# Patient Record
Sex: Female | Born: 1980 | Hispanic: Yes | Marital: Married | Smoking: Never smoker
Health system: Southern US, Community
[De-identification: ages and names within clinical notes are randomized; demographics above are authoritative.]

## PROBLEM LIST (undated history)

## (undated) DIAGNOSIS — I10 Essential (primary) hypertension: Secondary | ICD-10-CM

## (undated) DIAGNOSIS — O24419 Gestational diabetes mellitus in pregnancy, unspecified control: Secondary | ICD-10-CM

## (undated) HISTORY — PX: APPENDECTOMY: SHX54

## (undated) HISTORY — PX: CARPAL TUNNEL RELEASE: SHX101

---

## 2020-06-18 ENCOUNTER — Emergency Department: Payer: No Typology Code available for payment source

## 2020-06-18 ENCOUNTER — Other Ambulatory Visit: Payer: Self-pay

## 2020-06-18 ENCOUNTER — Encounter: Payer: Self-pay | Admitting: Emergency Medicine

## 2020-06-18 DIAGNOSIS — S169XXA Unspecified injury of muscle, fascia and tendon at neck level, initial encounter: Secondary | ICD-10-CM | POA: Diagnosis present

## 2020-06-18 DIAGNOSIS — S20219A Contusion of unspecified front wall of thorax, initial encounter: Secondary | ICD-10-CM | POA: Diagnosis not present

## 2020-06-18 DIAGNOSIS — M79672 Pain in left foot: Secondary | ICD-10-CM | POA: Insufficient documentation

## 2020-06-18 DIAGNOSIS — R102 Pelvic and perineal pain: Secondary | ICD-10-CM | POA: Insufficient documentation

## 2020-06-18 DIAGNOSIS — S161XXA Strain of muscle, fascia and tendon at neck level, initial encounter: Secondary | ICD-10-CM | POA: Diagnosis not present

## 2020-06-18 LAB — URINALYSIS, COMPLETE (UACMP) WITH MICROSCOPIC
Bilirubin Urine: NEGATIVE
Glucose, UA: NEGATIVE mg/dL
Hgb urine dipstick: NEGATIVE
Ketones, ur: NEGATIVE mg/dL
Nitrite: NEGATIVE
Protein, ur: 100 mg/dL — AB
Specific Gravity, Urine: 1.01 (ref 1.005–1.030)
pH: 7 (ref 5.0–8.0)

## 2020-06-18 LAB — CBC WITH DIFFERENTIAL/PLATELET
Abs Immature Granulocytes: 0.07 10*3/uL (ref 0.00–0.07)
Basophils Absolute: 0.1 10*3/uL (ref 0.0–0.1)
Basophils Relative: 1 %
Eosinophils Absolute: 0.3 10*3/uL (ref 0.0–0.5)
Eosinophils Relative: 2 %
HCT: 36.3 % (ref 36.0–46.0)
Hemoglobin: 12.2 g/dL (ref 12.0–15.0)
Immature Granulocytes: 1 %
Lymphocytes Relative: 32 %
Lymphs Abs: 4.1 10*3/uL — ABNORMAL HIGH (ref 0.7–4.0)
MCH: 27.7 pg (ref 26.0–34.0)
MCHC: 33.6 g/dL (ref 30.0–36.0)
MCV: 82.5 fL (ref 80.0–100.0)
Monocytes Absolute: 0.7 10*3/uL (ref 0.1–1.0)
Monocytes Relative: 5 %
Neutro Abs: 7.6 10*3/uL (ref 1.7–7.7)
Neutrophils Relative %: 59 %
Platelets: 291 10*3/uL (ref 150–400)
RBC: 4.4 MIL/uL (ref 3.87–5.11)
RDW: 13.4 % (ref 11.5–15.5)
WBC: 12.7 10*3/uL — ABNORMAL HIGH (ref 4.0–10.5)
nRBC: 0 % (ref 0.0–0.2)

## 2020-06-18 LAB — BASIC METABOLIC PANEL
Anion gap: 11 (ref 5–15)
BUN: 13 mg/dL (ref 6–20)
CO2: 22 mmol/L (ref 22–32)
Calcium: 9.1 mg/dL (ref 8.9–10.3)
Chloride: 104 mmol/L (ref 98–111)
Creatinine, Ser: 0.72 mg/dL (ref 0.44–1.00)
GFR calc Af Amer: >60 mL/min (ref >60)
GFR calc non Af Amer: >60 mL/min (ref >60)
Glucose, Bld: 101 mg/dL — ABNORMAL HIGH (ref 70–99)
Potassium: 3.1 mmol/L — ABNORMAL LOW (ref 3.5–5.1)
Sodium: 137 mmol/L (ref 135–145)

## 2020-06-18 LAB — POCT PREGNANCY, URINE: Preg Test, Ur: NEGATIVE

## 2020-06-18 MED ORDER — FENTANYL CITRATE (PF) 100 MCG/2ML IJ SOLN
50.0000 ug | INTRAMUSCULAR | Status: DC | PRN
  Administered 2020-06-18: 50 ug via NASAL
  Filled 2020-06-18: qty 2
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
## 2020-06-19 ENCOUNTER — Emergency Department
Admission: EM | Admit: 2020-06-19 | Discharge: 2020-06-19 | Disposition: A | Discharge status: Home / Self Care | Payer: No Typology Code available for payment source | Attending: Emergency Medicine | Admitting: Emergency Medicine

## 2020-06-19 DIAGNOSIS — S161XXA Strain of muscle, fascia and tendon at neck level, initial encounter: Secondary | ICD-10-CM

## 2020-06-19 DIAGNOSIS — S20219A Contusion of unspecified front wall of thorax, initial encounter: Secondary | ICD-10-CM

## 2020-06-19 HISTORY — DX: Essential (primary) hypertension: I10

## 2020-06-19 MED ORDER — KETOROLAC TROMETHAMINE 30 MG/ML IJ SOLN
INTRAMUSCULAR | Status: AC
  Filled 2020-06-19: qty 1

## 2020-06-19 MED ORDER — KETOROLAC TROMETHAMINE 30 MG/ML IJ SOLN
30.0000 mg | Freq: Once | INTRAMUSCULAR | Status: AC
  Administered 2020-06-19: 30 mg via INTRAMUSCULAR

## 2020-06-19 MED ORDER — NAPROXEN 500 MG PO TABS: 500.0000 mg | ORAL_TABLET | Freq: Two times a day (BID) | ORAL | 2 refills | Status: DC
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
## 2020-09-29 ENCOUNTER — Emergency Department
Admission: EM | Admit: 2020-09-29 | Discharge: 2020-09-29 | Disposition: A | Discharge status: Home / Self Care | Payer: HRSA Program | Attending: Emergency Medicine | Admitting: Emergency Medicine

## 2020-09-29 ENCOUNTER — Other Ambulatory Visit: Payer: Self-pay

## 2020-09-29 DIAGNOSIS — I10 Essential (primary) hypertension: Secondary | ICD-10-CM | POA: Diagnosis not present

## 2020-09-29 DIAGNOSIS — R519 Headache, unspecified: Secondary | ICD-10-CM | POA: Diagnosis present

## 2020-09-29 DIAGNOSIS — Z20822 Contact with and (suspected) exposure to covid-19: Secondary | ICD-10-CM

## 2020-09-29 DIAGNOSIS — U071 COVID-19: Secondary | ICD-10-CM | POA: Insufficient documentation

## 2020-09-29 LAB — SARS CORONAVIRUS 2 (TAT 6-24 HRS): SARS Coronavirus 2: POSITIVE — AB

## 2020-09-29 NOTE — ED Notes (Signed)
Pt assessed by provider prior to d/c.  

## 2020-09-29 NOTE — ED Provider Notes (Signed)
[facility] Emergency Department Provider Note   ____________________________________________    I have reviewed the triage vital signs and the nursing notes.   HISTORY  Chief Complaint covid test     HPI Clariza Sickman Beverly Gust is a 40 y.o. female who presents with Covid symptoms and is requesting a Covid test.  She describes body aches, chills, headaches.  She reports close exposure to someone who tested positive for Covid  Past Medical History:  Diagnosis Date  . Hypertension     There are no problems to display for this patient.   Past Surgical History:  Procedure Laterality Date  . APPENDECTOMY    . CARPAL TUNNEL RELEASE      Prior to Admission medications   Medication Sig Start Date End Date Taking? Authorizing Provider  naproxen (NAPROSYN) 500 MG tablet Take 1 tablet (500 mg total) by mouth 2 (two) times daily with a meal. 06/19/20   Jene Every, MD     Allergies Patient has no known allergies.  History reviewed. No pertinent family history.  Social History Social History   Tobacco Use  . Smoking status: Never Smoker  . Smokeless tobacco: Never Used  Substance Use Topics  . Alcohol use: Not Currently  . Drug use: Never    Review of Systems  Constitutional: As above  ENT: Positive sore throat   Gastrointestinal: No abdominal pain.    Musculoskeletal: Body aches Skin: Negative for rash. Neurological: Headaches    ____________________________________________   PHYSICAL EXAM:  VITAL SIGNS: ED Triage Vitals  Enc Vitals Group     BP 09/29/20 1151 (!) 147/89     Pulse Rate 09/29/20 1151 84     Resp 09/29/20 1151 18     Temp 09/29/20 1151 98 F (36.7 C)     Temp Source 09/29/20 1151 Oral     SpO2 09/29/20 1151 100 %     Weight 09/29/20 1151 88.5 kg (195 lb)     Height 09/29/20 1151 1.651 m (5\' 5" )     Head Circumference --      Peak Flow --      Pain Score 09/29/20 1158 7     Pain Loc --      Pain  Edu? --      Excl. in GC? --     Constitutional: Alert and oriented. No acute distress.  Eyes: Conjunctivae are normal.  Head: Atraumatic. Nose: No congestion/rhinnorhea. Mouth/Throat: Mucous membranes are moist.   Cardiovascular: Normal rate, regular rhythm.  Respiratory: Normal respiratory effort.  No retractions.  Musculoskeletal: No lower extremity tenderness nor edema.   Neurologic:  Normal speech and language. No gross focal neurologic deficits are appreciated.   Skin:  Skin is warm, dry and intact. No rash noted.   ____________________________________________   LABS (all labs ordered are listed, but only abnormal results are displayed)  Labs Reviewed  SARS CORONAVIRUS 2 (TAT 6-24 HRS)   ____________________________________________  EKG   ____________________________________________  RADIOLOGY   ____________________________________________   PROCEDURES  Procedure(s) performed: No  Procedures   Critical Care performed: No ____________________________________________   INITIAL IMPRESSION / ASSESSMENT AND PLAN / ED COURSE  Pertinent labs & imaging results that were available during my care of the patient were reviewed by me and considered in my medical decision making (see chart for details).  Patient with suspected COVID-19 infection, PCR test sent   ____________________________________________   FINAL CLINICAL IMPRESSION(S) / ED DIAGNOSES  Final diagnoses:  Suspected COVID-19  virus infection      NEW MEDICATIONS STARTED DURING THIS VISIT:  Discharge Medication List as of 09/29/2020 12:13 PM       Note:  This document was prepared using Dragon voice recognition software and may include unintentional dictation errors.   Jene Every, MD 09/29/20 1259

## 2020-09-29 NOTE — ED Triage Notes (Signed)
Close contact with covid pos. C/o back pain.

## 2022-01-07 ENCOUNTER — Emergency Department: Payer: Self-pay

## 2022-01-07 ENCOUNTER — Other Ambulatory Visit: Payer: Self-pay

## 2022-01-07 ENCOUNTER — Emergency Department
Admission: EM | Admit: 2022-01-07 | Discharge: 2022-01-07 | Disposition: A | Discharge status: Home / Self Care | Payer: Self-pay | Attending: Emergency Medicine | Admitting: Emergency Medicine

## 2022-01-07 DIAGNOSIS — R791 Abnormal coagulation profile: Secondary | ICD-10-CM | POA: Diagnosis not present

## 2022-01-07 DIAGNOSIS — M549 Dorsalgia, unspecified: Secondary | ICD-10-CM | POA: Diagnosis not present

## 2022-01-07 DIAGNOSIS — R0789 Other chest pain: Secondary | ICD-10-CM | POA: Diagnosis present

## 2022-01-07 DIAGNOSIS — R000 Tachycardia, unspecified: Secondary | ICD-10-CM | POA: Diagnosis not present

## 2022-01-07 DIAGNOSIS — Z7982 Long term (current) use of aspirin: Secondary | ICD-10-CM | POA: Insufficient documentation

## 2022-01-07 DIAGNOSIS — I10 Essential (primary) hypertension: Secondary | ICD-10-CM | POA: Insufficient documentation

## 2022-01-07 HISTORY — DX: Gestational diabetes mellitus in pregnancy, unspecified control: O24.419

## 2022-01-07 LAB — CBC
HCT: 39.7 % (ref 36.0–46.0)
Hemoglobin: 13.6 g/dL (ref 12.0–15.0)
MCH: 29.2 pg (ref 26.0–34.0)
MCHC: 34.3 g/dL (ref 30.0–36.0)
MCV: 85.4 fL (ref 80.0–100.0)
Platelets: 208 10*3/uL (ref 150–400)
RBC: 4.65 MIL/uL (ref 3.87–5.11)
RDW: 13.7 % (ref 11.5–15.5)
WBC: 11.5 10*3/uL — ABNORMAL HIGH (ref 4.0–10.5)
nRBC: 0 % (ref 0.0–0.2)

## 2022-01-07 LAB — D-DIMER, QUANTITATIVE: D-Dimer, Quant: 0.6 ug/mL-FEU — ABNORMAL HIGH (ref 0.00–0.50)

## 2022-01-07 LAB — COMPREHENSIVE METABOLIC PANEL
ALT: 17 U/L (ref 0–44)
AST: 18 U/L (ref 15–41)
Albumin: 3.8 g/dL (ref 3.5–5.0)
Alkaline Phosphatase: 62 U/L (ref 38–126)
Anion gap: 11 (ref 5–15)
BUN: 7 mg/dL (ref 6–20)
CO2: 19 mmol/L — ABNORMAL LOW (ref 22–32)
Calcium: 9.5 mg/dL (ref 8.9–10.3)
Chloride: 104 mmol/L (ref 98–111)
Creatinine, Ser: 0.46 mg/dL (ref 0.44–1.00)
GFR, Estimated: >60 mL/min (ref >60)
Glucose, Bld: 80 mg/dL (ref 70–99)
Potassium: 3.7 mmol/L (ref 3.5–5.1)
Sodium: 134 mmol/L — ABNORMAL LOW (ref 135–145)
Total Bilirubin: 0.6 mg/dL (ref 0.3–1.2)
Total Protein: 7.5 g/dL (ref 6.5–8.1)

## 2022-01-07 LAB — TROPONIN I (HIGH SENSITIVITY)
Troponin I (High Sensitivity): 5 ng/L (ref <18)
Troponin I (High Sensitivity): 4 ng/L (ref <18)

## 2022-01-07 NOTE — ED Triage Notes (Signed)
Pt to ED via POV, complains of sharp pain in L chest through to L upper back, worse with walking and with standing. Better with lying. Pain started this morning at 1030. ? ?Pt has gestational diabetes and pregestational HTN. ? ?Pt states is pregnant, at least 19 weeks. Fetus is moving. ? ?EDP at bedside with interpreter. ?

## 2022-01-07 NOTE — ED Notes (Signed)
Pt given crackers and water by request  ?

## 2022-01-07 NOTE — ED Notes (Signed)
Patient resting on stretcher in room. RR even and unlabored. Patient is a little restless due to the pain in her left chest that radiates to her left shoulder. Patient is removed from monitoring equipment so that she can go to the bathroom. Patient verbalizes no other needs or complaints at this time. Family updated on plan of care.  ?

## 2022-01-07 NOTE — ED Provider Notes (Signed)
? ?[facility] ?Provider Note ? ? ? Event Date/Time  ? First MD Initiated Contact with Patient 01/07/22 1538   ?  (approximate) ? ? ?History  ? ?Chest Pain and Back Pain ? ?Spanish telemetry interpreter utilized throughout.  Examination of the chest and chest wall performed with nurse Barbee Cougheina at bedside ? ?HPI ? ?Rachael Alexander is a 41 y.o. female who [facility] from April 11 of this year 41 y.o. [id number] at [id number] with hypertension and gestational diabetes. ? ?At 10 AM today patient began experiencing a somewhat sharp pain in the middle of her left upper chest along the edge of the breastbone that radiates towards her left shoulder and upper back.  It is worsened with standing walking and moving.  Is relieved by sitting still ? ?She took Tylenol 2 hours ago.  She takes a baby aspirin daily as directed by her OB clinic and [facility]. ? ?No shortness of breath.  No history of blood clots.  Taking medication for high blood pressure and also baby aspirin ? ?No nausea or vomiting.  No fevers or chills.  Denies cough. ? ?No leg swelling.  No history of blood clots. ?  ? ? ?Physical Exam  ? ?Triage Vital Signs: ?ED Triage Vitals  ?Enc Vitals Group  ?   BP 01/07/22 1542 (!) 154/92  ?   Pulse Rate 01/07/22 1542 (!) 106  ?   Resp 01/07/22 1542 16  ?   Temp 01/07/22 1542 98.1 ?F (36.7 ?C)  ?   Temp Source 01/07/22 1542 Oral  ?   SpO2 01/07/22 1542 100 %  ?   Weight 01/07/22 1553 187 lb (84.8 kg)  ?   Height 01/07/22 1553 5\' 3"  (1.6 m)  ?   Head Circumference --   ?   Peak Flow --   ?   Pain Score 01/07/22 1551 7  ?   Pain Loc --   ?   Pain Edu? --   ?   Excl. in GC? --   ? ? ?Most recent vital signs: ?Vitals:  ? 01/07/22 1900 01/07/22 1930  ?BP: 104/64 110/82  ?Pulse: 89 91  ?Resp: 17 15  ?Temp:    ?SpO2: 97% 99%  ? ? ? ?General: Awake, no distress.  Very pleasant.  Family at bedside as well.  Communicates without difficulty via  Spanish interpreter. ?CV:  Good peripheral perfusion.  Normal heart tones.  Examination of chest wall is normal.  No reproducible pain or tenderness to palpation of the anterior chest wall, but she does report with movement and attempt to stand or walk which she gets worsening of the pain in her left upper chest.  Relatively sharp in nature. ?Resp:  Normal effort.  Clear lungs bilaterally.  Normal work of breathing. ?Abd:  No distention.  Soft nontender nondistended.  Gravid just below the level of the umbilicus ?Other:  No lower extremity swelling edema venous cords or congestion.  No calf tenderness. ? ?Fetal heart tones noted, heart rate 150-160 ? ?ED Results / Procedures / Treatments  ? ?Labs ?(all labs ordered are listed, but only abnormal results are displayed) ?Labs Reviewed  ?CBC - Abnormal; Notable for the following components:  ?    Result Value  ? WBC 11.5 (*)   ? All other components within normal limits  ?COMPREHENSIVE METABOLIC PANEL - Abnormal; Notable for the following components:  ?  Sodium 134 (*)   ? CO2 19 (*)   ? All other components within normal limits  ?D-DIMER, QUANTITATIVE - Abnormal; Notable for the following components:  ? D-Dimer, Quant 0.60 (*)   ? All other components within normal limits  ?TROPONIN I (HIGH SENSITIVITY)  ?TROPONIN I (HIGH SENSITIVITY)  ? ? ? ?EKG ? ?Interpreted by me at 1547 ?Heart rate 110 ?QRS 90 ?QTc 480 ?Sinus tachycardia, slight baseline artifact.  No evidence of acute ischemia denoted. ? ? ?RADIOLOGY ? ? ? ?Chest x-ray interpreted and reviewed by me negative for acute finding ? ? ?PROCEDURES: ? ?Critical Care performed: No ? ?Procedures ? ? ?MEDICATIONS ORDERED IN ED: ?Medications - No data to display ? ? ?IMPRESSION / MDM / ASSESSMENT AND PLAN / ED COURSE  ?I reviewed the triage vital signs and the nursing notes. ?             ?               ? ?Differential diagnosis includes, but is not limited to, possible musculoskeletal or chest wall pain, costochondritis,  muscular pectoralis strain, or referred pain from the lungs.  Pleurisy is also considered.  Will evaluate and work-up and exclude ACS though her symptomatology seems atypical.  She denies any associate abdominal pain.  No acute obstetrical symptoms.  Reports feeling her baby moving normally not having any abdominal pain or contractions.  No loss of fluid.  Does not appear to have evidence of an acute obstetrical etiology of her presentation or pain today.  The pain is focused primarily in the left mid chest with radiation towards left back and shoulder.  She is hypertensive, but given her age and risk factors dissection would seem quite unlikely.  Additionally, low risk for pulmonary embolism her only noted risk factor at this point would be pregnancy status.  We will send D-dimer. ? ?----------------------------------------- ?3:59 PM on 01/07/2022 ?----------------------------------------- ?Awaiting EKG at this time, nursing working to obtain. ? ? ?In consideration of the patient's pregnancy status, utilize years algorithm to evaluate for risk of PE.  The patient is felt to be low risk, she does not have any clinical signs of DVT, no hemoptysis, and I feel most likely musculoskeletal etiology.  Her D-dimer is less than 1000 (600 today).  Pulmonary embolism reasonably excluded based on this testing ? ? ?The patient is on the cardiac monitor to evaluate for evidence of arrhythmia and/or significant heart rate changes. ? ?Clinical Course as of 01/07/22 1938  ?Sat Jan 07, 2022  ?[id number] Discussed with patient via Spanish interpreter risks and benefits of imaging study including chest x-ray as well as CT scan if required to further evaluate her chest pain.  Discussed risks such as risk of radiation, congenital abnormalities, early childhood cancers, etc.  Patient understanding and agreeable to proceeding with imaging studies. [MQ]  ?1714 D-dimer is noted to be elevated slightly greater than threshold. [MQ]  ?1714 Patient  was able to give additional history, reported that she actually started having some pain after straining her arm [MQ]  ?1715  she believes starting a lawn more on Thursday or Friday.  This significantly reduces my concern for possible thromboembolic disease as well. [MQ]  ?  ?Clinical Course User Index ?[MQ] Sharyn Creamer, MD  ? ?----------------------------------------- ?7:37 PM on 01/07/2022 ?----------------------------------------- ?Second troponin normal.  No evidence to support acute emergent etiology of patient's pain at this time.  Suspect given the history of symptoms starting after attempting to pull  start a lawnmower I suspect this is likely musculoskeletal in nature.  Very reassuring examination of ED.  Discussed with the patient via Spanish interpreter, [place], return precautions and plan and recommendation to follow-up with [facility]. ? ?Return precautions and treatment recommendations and follow-up discussed with the patient who is agreeable with the plan. ? ? ?FINAL CLINICAL IMPRESSION(S) / ED DIAGNOSES  ? ?Final diagnoses:  ?Atypical chest pain  ? ? ? ?Rx / DC Orders  ? ?ED Discharge Orders   ? ? None  ? ?  ? ? ? ?Note:  This document was prepared using Dragon voice recognition software and may include unintentional dictation errors. ?  Sharyn Creamer, MD ?01/07/22 1938 ? ?

## 2022-01-07 NOTE — ED Notes (Signed)
XRAY called to get chest XRAY, pt states she is okay with this even thought she is pregnant, MD requested this RN call XRAY  ?

## 2022-01-07 NOTE — ED Notes (Signed)
Discharge instructions and follow-up provided to patient with assistance of interpreter. Patient verbalized understanding. Patient ambulated out to the waiting room with a steady gait ?

## 2022-01-07 NOTE — ED Notes (Signed)
Fetal Heart tones noted to be 150's. ?

## 2022-07-07 ENCOUNTER — Observation Stay: Payer: Self-pay | Admitting: Certified Registered"

## 2022-07-07 ENCOUNTER — Observation Stay
Admission: EM | Admit: 2022-07-07 | Discharge: 2022-07-08 | Disposition: A | Discharge status: Home / Self Care | Payer: Self-pay | Attending: Surgery | Admitting: Surgery

## 2022-07-07 ENCOUNTER — Other Ambulatory Visit: Payer: Self-pay

## 2022-07-07 ENCOUNTER — Emergency Department: Payer: Self-pay

## 2022-07-07 ENCOUNTER — Encounter
Admission: EM | Disposition: A | Discharge status: Home / Self Care | Payer: Self-pay | Source: Home / Self Care | Attending: Emergency Medicine

## 2022-07-07 DIAGNOSIS — K810 Acute cholecystitis: Secondary | ICD-10-CM | POA: Diagnosis present

## 2022-07-07 DIAGNOSIS — E119 Type 2 diabetes mellitus without complications: Secondary | ICD-10-CM | POA: Insufficient documentation

## 2022-07-07 DIAGNOSIS — K8042 Calculus of bile duct with acute cholecystitis without obstruction: Principal | ICD-10-CM | POA: Insufficient documentation

## 2022-07-07 DIAGNOSIS — I10 Essential (primary) hypertension: Secondary | ICD-10-CM | POA: Insufficient documentation

## 2022-07-07 DIAGNOSIS — K8000 Calculus of gallbladder with acute cholecystitis without obstruction: Secondary | ICD-10-CM

## 2022-07-07 DIAGNOSIS — Z79899 Other long term (current) drug therapy: Secondary | ICD-10-CM | POA: Insufficient documentation

## 2022-07-07 LAB — COMPREHENSIVE METABOLIC PANEL
ALT: 24 U/L (ref 0–44)
AST: 19 U/L (ref 15–41)
Albumin: 4.4 g/dL (ref 3.5–5.0)
Alkaline Phosphatase: 102 U/L (ref 38–126)
Anion gap: 10 (ref 5–15)
BUN: 16 mg/dL (ref 6–20)
CO2: 21 mmol/L — ABNORMAL LOW (ref 22–32)
Calcium: 9.4 mg/dL (ref 8.9–10.3)
Chloride: 108 mmol/L (ref 98–111)
Creatinine, Ser: 0.8 mg/dL (ref 0.44–1.00)
GFR, Estimated: >60 mL/min (ref >60)
Glucose, Bld: 114 mg/dL — ABNORMAL HIGH (ref 70–99)
Potassium: 3.4 mmol/L — ABNORMAL LOW (ref 3.5–5.1)
Sodium: 139 mmol/L (ref 135–145)
Total Bilirubin: 0.7 mg/dL (ref 0.3–1.2)
Total Protein: 8.1 g/dL (ref 6.5–8.1)

## 2022-07-07 LAB — CBC
HCT: 42.9 % (ref 36.0–46.0)
HCT: 38.6 % (ref 36.0–46.0)
Hemoglobin: 14.2 g/dL (ref 12.0–15.0)
Hemoglobin: 12.9 g/dL (ref 12.0–15.0)
MCH: 28.6 pg (ref 26.0–34.0)
MCH: 28.6 pg (ref 26.0–34.0)
MCHC: 33.1 g/dL (ref 30.0–36.0)
MCHC: 33.4 g/dL (ref 30.0–36.0)
MCV: 86.3 fL (ref 80.0–100.0)
MCV: 85.6 fL (ref 80.0–100.0)
Platelets: 237 10*3/uL (ref 150–400)
Platelets: 208 10*3/uL (ref 150–400)
RBC: 4.97 MIL/uL (ref 3.87–5.11)
RBC: 4.51 MIL/uL (ref 3.87–5.11)
RDW: 13.2 % (ref 11.5–15.5)
RDW: 13.2 % (ref 11.5–15.5)
WBC: 13.1 10*3/uL — ABNORMAL HIGH (ref 4.0–10.5)
WBC: 12.7 10*3/uL — ABNORMAL HIGH (ref 4.0–10.5)
nRBC: 0 % (ref 0.0–0.2)
nRBC: 0 % (ref 0.0–0.2)

## 2022-07-07 LAB — HIV ANTIBODY (ROUTINE TESTING W REFLEX): HIV Screen 4th Generation wRfx: NONREACTIVE

## 2022-07-07 LAB — URINALYSIS, ROUTINE W REFLEX MICROSCOPIC
Bacteria, UA: NONE SEEN
Bilirubin Urine: NEGATIVE
Glucose, UA: NEGATIVE mg/dL
Ketones, ur: NEGATIVE mg/dL
Leukocytes,Ua: NEGATIVE
Nitrite: NEGATIVE
Protein, ur: NEGATIVE mg/dL
Specific Gravity, Urine: 1.012 (ref 1.005–1.030)
pH: 6 (ref 5.0–8.0)

## 2022-07-07 LAB — CREATININE, SERUM
Creatinine, Ser: 0.76 mg/dL (ref 0.44–1.00)
GFR, Estimated: >60 mL/min (ref >60)

## 2022-07-07 LAB — LIPASE, BLOOD: Lipase: 44 U/L (ref 11–51)

## 2022-07-07 LAB — POC URINE PREG, ED: Preg Test, Ur: NEGATIVE

## 2022-07-07 SURGERY — CHOLECYSTECTOMY, ROBOT-ASSISTED, LAPAROSCOPIC
Anesthesia: General | Site: Abdomen

## 2022-07-07 MED ORDER — ONDANSETRON HCL 4 MG/2ML IJ SOLN: 4.0000 mg | Freq: Once | INTRAMUSCULAR | Status: DC

## 2022-07-07 MED ORDER — MIDAZOLAM HCL 2 MG/2ML IJ SOLN
INTRAMUSCULAR | Status: AC
  Filled 2022-07-07: qty 2

## 2022-07-07 MED ORDER — PHENYLEPHRINE HCL (PRESSORS) 10 MG/ML IV SOLN
INTRAVENOUS | Status: DC | PRN
  Administered 2022-07-07: 80 ug via INTRAVENOUS
  Administered 2022-07-07: 160 ug via INTRAVENOUS

## 2022-07-07 MED ORDER — LIDOCAINE-EPINEPHRINE (PF) 1 %-1:200000 IJ SOLN
INTRAMUSCULAR | Status: DC | PRN
  Administered 2022-07-07: 30 mL

## 2022-07-07 MED ORDER — ONDANSETRON HCL 4 MG/2ML IJ SOLN: 4.0000 mg | Freq: Four times a day (QID) | INTRAMUSCULAR | Status: DC | PRN

## 2022-07-07 MED ORDER — SODIUM CHLORIDE 0.9 % IV BOLUS (SEPSIS)
1000.0000 mL | Freq: Once | INTRAVENOUS | Status: AC
  Administered 2022-07-07: 1000 mL via INTRAVENOUS

## 2022-07-07 MED ORDER — ACETAMINOPHEN 325 MG PO TABS: 650.0000 mg | ORAL_TABLET | Freq: Three times a day (TID) | ORAL | 0 refills | Status: AC | PRN

## 2022-07-07 MED ORDER — NIFEDIPINE ER OSMOTIC RELEASE 30 MG PO TB24: 60.0000 mg | ORAL_TABLET | Freq: Every day | ORAL | Status: DC

## 2022-07-07 MED ORDER — ENOXAPARIN SODIUM 40 MG/0.4ML IJ SOSY
40.0000 mg | PREFILLED_SYRINGE | INTRAMUSCULAR | Status: DC
  Administered 2022-07-08: 40 mg via SUBCUTANEOUS
  Filled 2022-07-07: qty 0.4

## 2022-07-07 MED ORDER — MORPHINE SULFATE (PF) 2 MG/ML IV SOLN
2.0000 mg | INTRAVENOUS | Status: DC | PRN
  Administered 2022-07-07 – 2022-07-08 (×2): 2 mg via INTRAVENOUS
  Filled 2022-07-07 (×2): qty 1

## 2022-07-07 MED ORDER — HYDROMORPHONE HCL 1 MG/ML IJ SOLN
1.0000 mg | Freq: Once | INTRAMUSCULAR | Status: AC
  Administered 2022-07-07: 1 mg via INTRAVENOUS
  Filled 2022-07-07: qty 1

## 2022-07-07 MED ORDER — DOCUSATE SODIUM 100 MG PO CAPS: 100.0000 mg | ORAL_CAPSULE | Freq: Two times a day (BID) | ORAL | 0 refills | Status: AC | PRN

## 2022-07-07 MED ORDER — ROCURONIUM BROMIDE 100 MG/10ML IV SOLN
INTRAVENOUS | Status: DC | PRN
  Administered 2022-07-07: 70 mg via INTRAVENOUS

## 2022-07-07 MED ORDER — LIDOCAINE HCL (CARDIAC) PF 100 MG/5ML IV SOSY
PREFILLED_SYRINGE | INTRAVENOUS | Status: DC | PRN
  Administered 2022-07-07: 50 mg via INTRAVENOUS

## 2022-07-07 MED ORDER — IBUPROFEN 800 MG PO TABS: 800.0000 mg | ORAL_TABLET | Freq: Three times a day (TID) | ORAL | 0 refills | Status: AC | PRN

## 2022-07-07 MED ORDER — PROPOFOL 10 MG/ML IV BOLUS
INTRAVENOUS | Status: DC | PRN
  Administered 2022-07-07: 150 mg via INTRAVENOUS

## 2022-07-07 MED ORDER — ONDANSETRON HCL 4 MG/2ML IJ SOLN
4.0000 mg | Freq: Once | INTRAMUSCULAR | Status: AC | PRN
  Administered 2022-07-07: 4 mg via INTRAVENOUS
  Filled 2022-07-07: qty 2

## 2022-07-07 MED ORDER — FENTANYL CITRATE (PF) 100 MCG/2ML IJ SOLN
INTRAMUSCULAR | Status: DC | PRN
  Administered 2022-07-07: 100 ug via INTRAVENOUS

## 2022-07-07 MED ORDER — DEXMEDETOMIDINE HCL IN NACL 80 MCG/20ML IV SOLN
INTRAVENOUS | Status: DC | PRN
  Administered 2022-07-07: 12 ug via BUCCAL
  Administered 2022-07-07: 8 ug via BUCCAL

## 2022-07-07 MED ORDER — HYDROCODONE-ACETAMINOPHEN 5-325 MG PO TABS: 1.0000 | ORAL_TABLET | Freq: Four times a day (QID) | ORAL | 0 refills | Status: AC | PRN

## 2022-07-07 MED ORDER — HYDROCODONE-ACETAMINOPHEN 5-325 MG PO TABS
1.0000 | ORAL_TABLET | ORAL | Status: DC | PRN
  Administered 2022-07-07: 2 via ORAL
  Administered 2022-07-07: 1 via ORAL
  Administered 2022-07-08: 2 via ORAL
  Filled 2022-07-07: qty 2
  Filled 2022-07-07: qty 1
  Filled 2022-07-07: qty 2

## 2022-07-07 MED ORDER — FENTANYL CITRATE (PF) 100 MCG/2ML IJ SOLN
INTRAMUSCULAR | Status: AC
  Filled 2022-07-07: qty 2

## 2022-07-07 MED ORDER — ONDANSETRON 4 MG PO TBDP: 4.0000 mg | ORAL_TABLET | Freq: Four times a day (QID) | ORAL | Status: DC | PRN

## 2022-07-07 MED ORDER — KETOROLAC TROMETHAMINE 30 MG/ML IJ SOLN
30.0000 mg | Freq: Once | INTRAMUSCULAR | Status: AC
  Administered 2022-07-07: 30 mg via INTRAVENOUS
  Filled 2022-07-07: qty 1

## 2022-07-07 MED ORDER — DEXAMETHASONE SODIUM PHOSPHATE 10 MG/ML IJ SOLN
INTRAMUSCULAR | Status: DC | PRN
  Administered 2022-07-07: 10 mg via INTRAVENOUS

## 2022-07-07 MED ORDER — KETOROLAC TROMETHAMINE 30 MG/ML IJ SOLN
INTRAMUSCULAR | Status: DC | PRN
  Administered 2022-07-07: 30 mg via INTRAVENOUS

## 2022-07-07 MED ORDER — FENTANYL CITRATE (PF) 100 MCG/2ML IJ SOLN
25.0000 ug | INTRAMUSCULAR | Status: DC | PRN
  Administered 2022-07-07 (×3): 25 ug via INTRAVENOUS

## 2022-07-07 MED ORDER — ONDANSETRON HCL 4 MG/2ML IJ SOLN
INTRAMUSCULAR | Status: DC | PRN
  Administered 2022-07-07: 4 mg via INTRAVENOUS

## 2022-07-07 MED ORDER — HYDROMORPHONE HCL 1 MG/ML IJ SOLN
INTRAMUSCULAR | Status: AC
  Filled 2022-07-07: qty 1

## 2022-07-07 MED ORDER — DOCUSATE SODIUM 100 MG PO CAPS: 100.0000 mg | ORAL_CAPSULE | Freq: Two times a day (BID) | ORAL | Status: DC | PRN

## 2022-07-07 MED ORDER — FENTANYL CITRATE (PF) 100 MCG/2ML IJ SOLN
INTRAMUSCULAR | Status: AC
  Administered 2022-07-07: 25 ug via INTRAVENOUS
  Filled 2022-07-07: qty 2

## 2022-07-07 MED ORDER — SUGAMMADEX SODIUM 200 MG/2ML IV SOLN
INTRAVENOUS | Status: DC | PRN
  Administered 2022-07-07: 200 mg via INTRAVENOUS

## 2022-07-07 MED ORDER — LIDOCAINE-EPINEPHRINE (PF) 1 %-1:200000 IJ SOLN
INTRAMUSCULAR | Status: AC
  Filled 2022-07-07: qty 30

## 2022-07-07 MED ORDER — LACTATED RINGERS IV SOLN: INTRAVENOUS | Status: DC | PRN

## 2022-07-07 MED ORDER — MORPHINE SULFATE (PF) 4 MG/ML IV SOLN
4.0000 mg | Freq: Once | INTRAVENOUS | Status: AC
  Administered 2022-07-07: 4 mg via INTRAVENOUS
  Filled 2022-07-07: qty 1

## 2022-07-07 MED ORDER — PIPERACILLIN-TAZOBACTAM 3.375 G IVPB 30 MIN
3.3750 g | Freq: Once | INTRAVENOUS | Status: AC
  Administered 2022-07-07: 3.375 g via INTRAVENOUS

## 2022-07-07 MED ORDER — SODIUM CHLORIDE 0.9 % IV SOLN: INTRAVENOUS | Status: DC

## 2022-07-07 MED ORDER — TRAMADOL HCL 50 MG PO TABS
50.0000 mg | ORAL_TABLET | Freq: Four times a day (QID) | ORAL | Status: DC | PRN
  Administered 2022-07-08: 50 mg via ORAL
  Filled 2022-07-07: qty 1

## 2022-07-07 MED ORDER — BUPIVACAINE HCL (PF) 0.5 % IJ SOLN
INTRAMUSCULAR | Status: AC
  Filled 2022-07-07: qty 30

## 2022-07-07 MED ORDER — 0.9 % SODIUM CHLORIDE (POUR BTL) OPTIME
TOPICAL | Status: DC | PRN
  Administered 2022-07-07: 250 mL

## 2022-07-07 MED ORDER — PIPERACILLIN-TAZOBACTAM 3.375 G IVPB
INTRAVENOUS | Status: AC
  Filled 2022-07-07: qty 50

## 2022-07-07 MED ORDER — INDOCYANINE GREEN 25 MG IV SOLR
1.2500 mg | Freq: Once | INTRAVENOUS | Status: AC
  Administered 2022-07-07: 1.25 mg via INTRAVENOUS
  Filled 2022-07-07: qty 0.5

## 2022-07-07 MED ORDER — ONDANSETRON HCL 4 MG/2ML IJ SOLN: 4.0000 mg | Freq: Once | INTRAMUSCULAR | Status: DC | PRN

## 2022-07-07 SURGICAL SUPPLY — 54 items
ANCHOR TIS RET SYS 235ML (MISCELLANEOUS) ×1 IMPLANT
BAG PRESSURE INF DISP 1000 (BAG) IMPLANT
BLADE SURG SZ11 CARB STEEL (BLADE) ×1 IMPLANT
CANNULA REDUC XI 12-8 STAPL (CANNULA) ×1
CANNULA REDUCER 12-8 DVNC XI (CANNULA) ×1 IMPLANT
CATH REDDICK CHOLANGI 4FR 50CM (CATHETERS) IMPLANT
CLIP LIGATING HEMO O LOK GREEN (MISCELLANEOUS) ×1 IMPLANT
DERMABOND ADVANCED .7 DNX12 (GAUZE/BANDAGES/DRESSINGS) ×1 IMPLANT
DRAPE ARM DVNC X/XI (DISPOSABLE) ×4 IMPLANT
DRAPE C-ARM XRAY 36X54 (DRAPES) IMPLANT
DRAPE COLUMN DVNC XI (DISPOSABLE) ×1 IMPLANT
DRAPE DA VINCI XI ARM (DISPOSABLE) ×4
DRAPE DA VINCI XI COLUMN (DISPOSABLE) ×1
ELECT CAUTERY BLADE 6.4 (BLADE) ×1 IMPLANT
ELECT REM PT RETURN 9FT ADLT (ELECTROSURGICAL) ×1
ELECTRODE REM PT RTRN 9FT ADLT (ELECTROSURGICAL) ×1 IMPLANT
GLOVE BIOGEL PI IND STRL 7.0 (GLOVE) ×2 IMPLANT
GLOVE SURG SYN 6.5 ES PF (GLOVE) ×2 IMPLANT
GLOVE SURG SYN 6.5 PF PI (GLOVE) ×2 IMPLANT
GOWN STRL REUS W/ TWL LRG LVL3 (GOWN DISPOSABLE) ×3 IMPLANT
GOWN STRL REUS W/TWL LRG LVL3 (GOWN DISPOSABLE) ×3
GRASPER SUT TROCAR 14GX15 (MISCELLANEOUS) IMPLANT
IRRIGATOR SUCT 8 DISP DVNC XI (IRRIGATION / IRRIGATOR) IMPLANT
IRRIGATOR SUCTION 8MM XI DISP (IRRIGATION / IRRIGATOR) ×1
IV NS 1000ML (IV SOLUTION) ×1
IV NS 1000ML BAXH (IV SOLUTION) IMPLANT
LABEL OR SOLS (LABEL) ×1 IMPLANT
MANIFOLD NEPTUNE II (INSTRUMENTS) ×1 IMPLANT
NDL INSUFFLATION 14GA 120MM (NEEDLE) ×1 IMPLANT
NEEDLE HYPO 22GX1.5 SAFETY (NEEDLE) ×1 IMPLANT
NEEDLE INSUFFLATION 14GA 120MM (NEEDLE) ×1 IMPLANT
NS IRRIG 500ML POUR BTL (IV SOLUTION) ×1 IMPLANT
OBTURATOR OPTICAL STANDARD 8MM (TROCAR) ×1
OBTURATOR OPTICAL STND 8 DVNC (TROCAR) ×1
OBTURATOR OPTICALSTD 8 DVNC (TROCAR) ×1 IMPLANT
PACK LAP CHOLECYSTECTOMY (MISCELLANEOUS) ×1 IMPLANT
PENCIL SMOKE EVACUATOR (MISCELLANEOUS) ×1 IMPLANT
SEAL CANN UNIV 5-8 DVNC XI (MISCELLANEOUS) ×3 IMPLANT
SEAL XI 5MM-8MM UNIVERSAL (MISCELLANEOUS) ×3
SET TUBE SMOKE EVAC HIGH FLOW (TUBING) ×1 IMPLANT
SLEEVE SCD COMPRESS KNEE MED (STOCKING) IMPLANT
SOLUTION ELECTROLUBE (MISCELLANEOUS) ×1 IMPLANT
SPIKE FLUID TRANSFER (MISCELLANEOUS) ×2 IMPLANT
STAPLER CANNULA SEAL DVNC XI (STAPLE) ×1 IMPLANT
STAPLER CANNULA SEAL XI (STAPLE) ×1
SUT MNCRL 4-0 (SUTURE) ×3
SUT MNCRL 4-0 27XMFL (SUTURE) ×3
SUT VICRYL 0 AB UR-6 (SUTURE) ×1 IMPLANT
SUTURE MNCRL 4-0 27XMF (SUTURE) ×2 IMPLANT
SYR 30ML LL (SYRINGE) IMPLANT
SYSTEM WECK SHIELD CLOSURE (TROCAR) IMPLANT
TRAP FLUID SMOKE EVACUATOR (MISCELLANEOUS) ×1 IMPLANT
TUBE GASTRO FEEDING 16FR (TUBING) IMPLANT
WATER STERILE IRR 500ML POUR (IV SOLUTION) ×1 IMPLANT

## 2022-07-07 NOTE — H&P (Signed)
Subjective:   CC: acute cholecystitis  HPI:  Rachael Alexander Rosaland Lao is a 41 y.o. female who is consulted by [facility] for evaluation of above cc.  Symptoms were first noted 2 weeks ago. Pain is intermittent, wax and wane.  Associated with nausea, exacerbated by eating.     Past Medical History:  has a past medical history of Gestational diabetes and Hypertension.  Past Surgical History:  has a past surgical history that includes Appendectomy and Carpal tunnel release.  Family History: reviewed and not relevant to CC  Social History:  reports that she has never smoked. She has never used smokeless tobacco. She reports that she does not currently use alcohol. She reports that she does not use drugs.  Current Medications:  Prior to Admission medications   Medication Sig Start Date End Date Taking? Authorizing Provider  naproxen (NAPROSYN) 500 MG tablet Take 1 tablet (500 mg total) by mouth 2 (two) times daily with a meal. Patient not taking: Reported on 01/07/2022 06/19/20   Lavonia Drafts, MD  NIFEdipine (PROCARDIA XL/NIFEDICAL XL) 60 MG 24 hr tablet Take 60 mg by mouth daily. 11/30/21   [provider]  ondansetron (ZOFRAN) 4 MG tablet Take 4 mg by mouth every 8 (eight) hours as needed. 11/15/21   [provider]  Prenatal Vit-Fe Fumarate-FA (M-NATAL PLUS) 27-1 MG TABS Take 1 tablet by mouth daily. 11/15/21   [provider]  Vitamin D, Ergocalciferol, (DRISDOL) 1.25 MG (50000 UNIT) CAPS capsule Take 50,000 Units by mouth once a week. 09/29/21   [provider]    Allergies:  Allergies as of 07/07/2022   (No Known Allergies)    ROS:  General: Denies weight loss, weight gain, fatigue, fevers, chills, and night sweats. Eyes: Denies blurry vision, double vision, eye pain, itchy eyes, and tearing. Ears: Denies hearing loss, earache, and ringing in ears. Nose: Denies sinus pain, congestion, infections, runny nose, and nosebleeds. Mouth/throat: Denies hoarseness,  sore throat, bleeding gums, and difficulty swallowing. Heart: Denies chest pain, palpitations, racing heart, irregular heartbeat, leg pain or swelling, and decreased activity tolerance. Respiratory: Denies breathing difficulty, shortness of breath, wheezing, cough, and sputum. GI: Denies change in appetite, heartburn, vomiting, constipation, diarrhea, and blood in stool. GU: Denies difficulty urinating, pain with urinating, urgency, frequency, blood in urine. Musculoskeletal: Denies joint stiffness, pain, swelling, muscle weakness. Skin: Denies rash, itching, mass, tumors, sores, and boils Neurologic: Denies headache, fainting, dizziness, seizures, numbness, and tingling. Psychiatric: Denies depression, anxiety, difficulty sleeping, and memory loss. Endocrine: Denies heat or cold intolerance, and increased thirst or urination. Blood/lymph: Denies easy bruising, and swollen glands     Objective:     BP (!) 146/94 (BP Location: Left Arm)   Pulse (!) 102   Temp 98.5 F (36.9 C) (Rectal)   Resp (!) 24   Wt 59.9 kg   SpO2 98%   Breastfeeding Unknown   BMI 23.38 kg/m    Constitutional :  alert, cooperative, appears stated age, and no distress  Lymphatics/Throat:  no asymmetry, masses, or scars  Respiratory:  clear to auscultation bilaterally  Cardiovascular:  regular rate and rhythm  Gastrointestinal: Soft, no guarding, some TTP epigastric region .   Musculoskeletal: Steady movement  Skin: Cool and moist, lap surgical scars  Psychiatric: Normal affect, non-agitated, not confused       LABS:     Latest Ref Rng & Units 07/07/2022    4:41 AM 01/07/2022    3:59 PM 06/18/2020    9:38 PM  CMP  Glucose 70 - 99 mg/dL 114  80  101   BUN 6 - 20 mg/dL 16  7  13    Creatinine 0.44 - 1.00 mg/dL 0.80  0.46  0.72   Sodium 135 - 145 mmol/L 139  134  137   Potassium 3.5 - 5.1 mmol/L 3.4  3.7  3.1   Chloride 98 - 111 mmol/L 108  104  104   CO2 22 - 32 mmol/L 21  19  22    Calcium 8.9 - 10.3  mg/dL 9.4  9.5  9.1   Total Protein 6.5 - 8.1 g/dL 8.1  7.5    Total Bilirubin 0.3 - 1.2 mg/dL 0.7  0.6    Alkaline Phos 38 - 126 U/L 102  62    AST 15 - 41 U/L 19  18    ALT 0 - 44 U/L 24  17        Latest Ref Rng & Units 07/07/2022    4:41 AM 01/07/2022    3:59 PM 06/18/2020    9:38 PM  CBC  WBC 4.0 - 10.5 K/uL 13.1  11.5  12.7   Hemoglobin 12.0 - 15.0 g/dL 14.2  13.6  12.2   Hematocrit 36.0 - 46.0 % 42.9  39.7  36.3   Platelets 150 - 400 K/uL 237  208  291      RADS: CLINICAL DATA:  Right upper quadrant abdominal pain.   EXAM: ULTRASOUND ABDOMEN LIMITED RIGHT UPPER QUADRANT   COMPARISON:  None Available.   FINDINGS: Gallbladder:   Multiple shadowing gallstones are present measuring up to 10 mm. Stones are mobile. Gallbladder is otherwise filled with sludge. The gallbladder wall is thickened at 5.3 mm. Eighty sonographic Percell Miller sign is reported.   Common bile duct:   Diameter: 2.9 mm, within normal limits   Liver:   No focal lesion identified. Within normal limits in parenchymal echogenicity. Portal vein is patent on color Doppler imaging with normal direction of blood flow towards the liver.   Other: None.   IMPRESSION: 1. Cholelithiasis with gallbladder wall thickening and a positive sonographic Murphy sign. Findings are consistent with acute cholecystitis. 2. Normal appearance of the common bile duct and liver.     Electronically Signed   By: San Morelle M.D.   On: 07/07/2022 07:01   Assessment:      Acute cholecystitis  Plan:      Discussed the risk of surgery including post-op infxn, seroma, biloma, chronic pain, poor-delayed wound healing, retained gallstone, conversion to open procedure, post-op SBO or ileus, and need for additional procedures to address said risks.  The risks of general anesthetic including MI, CVA, sudden death or even reaction to anesthetic medications also discussed. Alternatives include continued observation.   Benefits include possible symptom relief, prevention of complications including acute cholecystitis, pancreatitis.  Typical post operative recovery of 3-5 days rest, continued pain in area and incision sites, possible loose stools up to 4-6 weeks, also discussed.  The patient understands the risks, any and all questions were answered to the patient's satisfaction.  To OR for robotic lap chole. IVF, zosyn, NPO in the meantime  labs/images/medications/previous chart entries reviewed personally and relevant changes/updates noted above.

## 2022-07-07 NOTE — Anesthesia Procedure Notes (Signed)
Procedure Name: Intubation Date/Time: 07/07/2022 1:51 PM  Performed by: Beverely Low, CRNAPre-anesthesia Checklist: Patient identified, Patient being monitored, Timeout performed, Emergency Drugs available and Suction available Patient Re-evaluated:Patient Re-evaluated prior to induction Oxygen Delivery Method: Circle system utilized Preoxygenation: Pre-oxygenation with 100% oxygen Induction Type: IV induction Ventilation: Mask ventilation without difficulty Laryngoscope Size: McGraph and 3 Grade View: Grade I Tube type: Oral Tube size: 6.5 mm Number of attempts: 1 Airway Equipment and Method: Stylet Placement Confirmation: ETT inserted through vocal cords under direct vision, positive ETCO2 and breath sounds checked- equal and bilateral Secured at: 21 cm Tube secured with: Tape Dental Injury: Teeth and Oropharynx as per pre-operative assessment

## 2022-07-07 NOTE — Op Note (Signed)
Preoperative diagnosis:  acute and cholecystitis  Postoperative diagnosis: same as above  Procedure: Robotic assisted Laparoscopic Cholecystectomy.   Anesthesia: GETA   Surgeon: Benjamine Sprague  Specimen: Gallbladder  Complications: None  EBL: 64mL  Wound Classification: Clean Contaminated  Indications: see HPI  Findings: Critical view of safety noted Cystic duct and artery identified, ligated and divided, clips remained intact at end of procedure Adequate hemostasis  Description of procedure:  The patient was placed on the operating table in the supine position. SCDs placed, pre-op abx administered.  General anesthesia was induced and OG tube placed by anesthesia. A time-out was completed verifying correct patient, procedure, site, positioning, and implant(s) and/or special equipment prior to beginning this procedure. The abdomen was prepped and draped in the usual sterile fashion.    Veress needle was placed at the Palmer's point and insufflation was started after confirming a positive saline drop test and no immediate increase in abdominal pressure.  After reaching 15 mm, the Veress needle was removed and a 8 mm port was placed via optiview technique under umbilicus measured 02VO from gallbladder.  The abdomen was inspected and no abnormalities or injuries were found.  Under direct vision, ports were placed in the following locations: One 12 mm patient left of the umbilicus, 8cm from the optiviewed port, one 8 mm port placed to the patient right of the umbilical port 8 cm apart.  1 additional 8 mm port placed lateral to the 69mm port.  Once ports were placed, The table was placed in the reverse Trendelenburg position with the right side up. The Xi platform was brought into the operative field and docked to the ports successfully.  An endoscope was placed through the umbilical port, fenestrated grasper through the adjacent patient right port, prograsp to the far patient left port, and then  a hook cautery in the left port.  Gallbladder decompressed due to extreme thickness and enlarged wall. The dome of the gallbladder was then grasped with prograsp, passed and retracted over the dome of the liver. Adhesions between the gallbladder and omentum, duodenum and transverse colon were lysed via hook cautery. The infundibulum was grasped with the fenestrated grasper and retracted toward the right lower quadrant. This maneuver exposed Calot's triangle. The peritoneum overlying the gallbladder infundibulum was then dissected  and the cystic duct and cystic artery identified.  Critical view of safety with the liver bed clearly visible behind the duct and artery with no additional structures noted.  The cystic duct clipped and divided close to the gallbladder.  tiny artery cauterized with bipolar   The gallbladder was then dissected from its peritoneal and liver bed attachments by electrocautery. Hemostasis was checked prior to removing the hook cautery and the Endo Catch bag was then placed through the 12 mm port and the gallbladder was removed.  The gallbladder was passed off the table as a specimen. There was no evidence of bleeding from the gallbladder fossa or cystic artery or leakage of the bile from the cystic duct stump. The 12 mm port site closed with Efx Shield using 0 vicryl under direct vision.  Abdomen desufflated and secondary trocars were removed under direct vision. No bleeding was noted. All skin incisions then closed with subcuticular sutures of 4-0 monocryl and dressed with topical skin adhesive. The orogastric tube was removed and patient extubated.  The patient tolerated the procedure well and was taken to the postanesthesia care unit in stable condition.  All sponge and instrument count correct at end of  procedure.

## 2022-07-07 NOTE — ED Provider Notes (Signed)
[facility] Provider Note    Event Date/Time   First MD Initiated Contact with Patient 07/07/22 0500     (approximate)   History   Abdominal Pain   HPI  Rachael Alexander Beverly Gust is a 41 y.o. female  with h/o HTN, type 2 diabetes who presents to the emergency department with complaints of 3 weeks of intermittent upper abdominal pain that radiates into her back and right shoulder blade.  Symptoms worsened last night around 11 PM.  Has had nausea without vomiting.  No fevers, diarrhea, dysuria, hematuria, vaginal discharge.  She is still having vaginal bleeding since spontaneous vaginal delivery on 05/21/2022.  She is breast-feeding.  She has had previous appendectomy and surgery for ectopic pregnancy.  States tonight for dinner she ate eggs, tortillas and milk.     History provided by patient using Spanish interpreter.    Past Medical History:  Diagnosis Date   Gestational diabetes    Hypertension     Past Surgical History:  Procedure Laterality Date   APPENDECTOMY     CARPAL TUNNEL RELEASE      MEDICATIONS:  Prior to Admission medications   Medication Sig Start Date End Date Taking? Authorizing Provider  naproxen (NAPROSYN) 500 MG tablet Take 1 tablet (500 mg total) by mouth 2 (two) times daily with a meal. Patient not taking: Reported on 01/07/2022 06/19/20   Jene Every, MD  NIFEdipine (PROCARDIA XL/NIFEDICAL XL) 60 MG 24 hr tablet Take 60 mg by mouth daily. 11/30/21   [provider]  ondansetron (ZOFRAN) 4 MG tablet Take 4 mg by mouth every 8 (eight) hours as needed. 11/15/21   [provider]  Prenatal Vit-Fe Fumarate-FA (M-NATAL PLUS) 27-1 MG TABS Take 1 tablet by mouth daily. 11/15/21   [provider]  Vitamin D, Ergocalciferol, (DRISDOL) 1.25 MG (50000 UNIT) CAPS capsule Take 50,000 Units by mouth once a week. 09/29/21   [provider]    Physical Exam   Triage Vital Signs: ED Triage Vitals [07/07/22  0422]  Enc Vitals Group     BP (!) 146/94     Pulse Rate (!) 102     Resp (!) 24     Temp 97.8 F (36.6 C)     Temp Source Oral     SpO2 98 %     Weight 132 lb (59.9 kg)     Height      Head Circumference      Peak Flow      Pain Score 10     Pain Loc      Pain Edu?      Excl. in GC?     Most recent vital signs: Vitals:   07/07/22 0422 07/07/22 0525  BP: (!) 146/94   Pulse: (!) 102   Resp: (!) 24   Temp: 97.8 F (36.6 C) 98.5 F (36.9 C)  SpO2: 98%     CONSTITUTIONAL: Alert and oriented and responds appropriately to questions.  Appears uncomfortable, tearful, shaking HEAD: Normocephalic, atraumatic EYES: Conjunctivae clear, pupils appear equal, sclera nonicteric ENT: normal nose; moist mucous membranes NECK: Supple, normal ROM CARD: RRR; S1 and S2 appreciated; no murmurs, no clicks, no rubs, no gallops RESP: Normal chest excursion without splinting or tachypnea; breath sounds clear and equal bilaterally; no wheezes, no rhonchi, no rales, no hypoxia or respiratory distress, speaking full sentences ABD/GI: Normal bowel sounds; non-distended; soft, tender to palpation in the right upper quadrant with positive Murphy sign.  No guarding, rebound on exam. BACK: The back appears normal EXT: Normal ROM in all joints; no deformity noted, no edema; no cyanosis SKIN: Normal color for age and race; warm; no rash on exposed skin NEURO: Moves all extremities equally, normal speech PSYCH: The patient's mood and manner are appropriate.   ED Results / Procedures / Treatments   LABS: (all labs ordered are listed, but only abnormal results are displayed) Labs Reviewed  COMPREHENSIVE METABOLIC PANEL - Abnormal; Notable for the following components:      Result Value   Potassium 3.4 (*)    CO2 21 (*)    Glucose, Bld 114 (*)    All other components within normal limits  CBC - Abnormal; Notable for the following components:   WBC 13.1 (*)    All other components within normal  limits  URINALYSIS, ROUTINE W REFLEX MICROSCOPIC - Abnormal; Notable for the following components:   Color, Urine YELLOW (*)    APPearance CLEAR (*)    Hgb urine dipstick MODERATE (*)    All other components within normal limits  LIPASE, BLOOD  POC URINE PREG, ED     EKG:   RADIOLOGY: My personal review and interpretation of imaging: Ultrasound shows acute cholecystitis.  I have personally reviewed all radiology reports.   US ABDOMEN LIMITED RUQ (LIVER/GB)  Result Date: 07/07/2022 CLINICAL DATA:  Right upper quadrant abdominal pain. EXAM: ULTRASOUND ABDOMEN LIMITED RIGHT UPPER QUADRANT COMPARISON:  Rachael Alexander Available. FINDINGS: Gallbladder: Multiple shadowing gallstones are present measuring up to 10 mm. Stones are mobile. Gallbladder is otherwise filled with sludge. The gallbladder wall is thickened at 5.3 mm. Eighty sonographic Eulah Pont sign is reported. Common bile duct: Diameter: 2.9 mm, within normal limits Liver: No focal lesion identified. Within normal limits in parenchymal echogenicity. Portal vein is patent on color Doppler imaging with normal direction of blood flow towards the liver. Other: Rachael Alexander. IMPRESSION: 1. Cholelithiasis with gallbladder wall thickening and a positive sonographic Murphy sign. Findings are consistent with acute cholecystitis. 2. Normal appearance of the common bile duct and liver. Electronically Signed   By: Marin Roberts M.D.   On: 07/07/2022 07:01     PROCEDURES:  Critical Care performed: No      Procedures    IMPRESSION / MDM / ASSESSMENT AND PLAN / ED COURSE  I reviewed the triage vital signs and the nursing notes.    Patient here with complaints of right upper quadrant abdominal pain that radiates into her back worse with eating for the past 3 weeks.  Symptoms worsened today.  Does have shaking versus rigors on exam but rectal temperature is normal.  Shaking could be secondary to pain.    DIFFERENTIAL DIAGNOSIS (includes but not  limited to):   Cholecystitis, cholelithiasis, choledocholithiasis, cholangitis, pancreatitis, gastritis, GERD   Patient's presentation is most consistent with acute presentation with potential threat to life or bodily function.   PLAN: We will obtain CBC, CMP, lipase, urinalysis, urine pregnancy test, right upper quadrant ultrasound.  She has already received morphine without much relief.  Will give Toradol, Dilaudid.  Will give IV fluids.   MEDICATIONS GIVEN IN ED: Medications  piperacillin-tazobactam (ZOSYN) IVPB 3.375 g (has no administration in time range)  0.9 %  sodium chloride infusion (has no administration in time range)  ondansetron (ZOFRAN) injection 4 mg (4 mg Intravenous Given 07/07/22 0442)  morphine (PF) 4 MG/ML injection 4 mg (4 mg Intravenous Given 07/07/22 0442)  sodium chloride 0.9 % bolus 1,000 mL (0 mLs  Intravenous Stopped 07/07/22 0526)  ketorolac (TORADOL) 30 MG/ML injection 30 mg (30 mg Intravenous Given 07/07/22 0525)  HYDROmorphone (DILAUDID) injection 1 mg (1 mg Intravenous Given 07/07/22 0525)     ED COURSE: Labs show leukocytosis of 13,000.  Normal electrolytes, LFTs and lipase.  Pregnancy test negative.  Urine does not appear infected.  Ultrasound reviewed and interpreted by myself and the radiologist and shows acute cholecystitis, cholelithiasis but no choledocholithiasis.  Will discuss with surgery.  We will continue IV hydration, keep her n.p.o. and give Zosyn.  She states her pain is well controlled.  We will provide her with a breast pump in the ED as well.   CONSULTS: Discussed with Dr. Lysle Pearl with general surgery who will see patient in the ED for admission.  Appreciate his help.   OUTSIDE RECORDS REVIEWED: Reviewed patient's delivery note on 05/22/2022 at [facility].  Delivery was uncomplicated.       FINAL CLINICAL IMPRESSION(S) / ED DIAGNOSES   Final diagnoses:  Calculus of gallbladder with acute cholecystitis without obstruction     Rx / DC  Orders   ED Discharge Orders     Rachael Alexander        Note:  This document was prepared using Dragon voice recognition software and may include unintentional dictation errors.   Escher Harr, Delice Bison, DO 07/07/22 [phone]

## 2022-07-07 NOTE — ED Triage Notes (Signed)
Pt reports increased urinary frequency. States abd pain occurs ~2 times/wk

## 2022-07-07 NOTE — Discharge Instructions (Signed)
Laparoscopic Cholecystectomy, Care After This sheet gives you information about how to care for yourself after your procedure. Your doctor may also give you more specific instructions. If you have problems or questions, contact your doctor. Follow these instructions at home: Care for cuts from surgery (incisions)  Follow instructions from your doctor about how to take care of your cuts from surgery. Make sure you: Wash your hands with soap and water before you change your bandage (dressing). If you cannot use soap and water, use hand sanitizer. Change your bandage as told by your doctor. Leave stitches (sutures), skin glue, or skin tape (adhesive) strips in place. They may need to stay in place for 2 weeks or longer. If tape strips get loose and curl up, you may trim the loose edges. Do not remove tape strips completely unless your doctor says it is okay. Do not take baths, swim, or use a hot tub until your doctor says it is okay. OK TO SHOWER 24HRS AFTER YOUR SURGERY.  Check your surgical cut area every day for signs of infection. Check for: More redness, swelling, or pain. More fluid or blood. Warmth. Pus or a bad smell. Activity Do not drive or use heavy machinery while taking prescription pain medicine. Do not play contact sports until your doctor says it is okay. Do not drive for 24 hours if you were given a medicine to help you relax (sedative). Rest as needed. Do not return to work or school until your doctor says it is okay. General instructions  tylenol and advil as needed for discomfort.  Please alternate between the two every four hours as needed for pain.    Use narcotics, if prescribed, only when tylenol and motrin is not enough to control pain. PLEASE PUMP AND DUMP YOUR BREAST MILK WHILE USING MEDICATIONS PRESCRIBED EXCEPT FOR THE TYLENOL  325-650mg  every 8hrs to max of 3000mg /24hrs (including the 325mg  in every norco dose) for the tylenol.    Advil up to 800mg  per dose every  8hrs as needed for pain.   To prevent or treat constipation while you are taking prescription pain medicine, your doctor may recommend that you: Drink enough fluid to keep your pee (urine) clear or pale yellow. Take over-the-counter or prescription medicines. Eat foods that are high in fiber, such as fresh fruits and vegetables, whole grains, and beans. Limit foods that are high in fat and processed sugars, such as fried and sweet foods. Contact a doctor if: You develop a rash. You have more redness, swelling, or pain around your surgical cuts. You have more fluid or blood coming from your surgical cuts. Your surgical cuts feel warm to the touch. You have pus or a bad smell coming from your surgical cuts. You have a fever. One or more of your surgical cuts breaks open. You have trouble breathing. You have chest pain. You have pain that is getting worse in your shoulders. You faint or feel dizzy when you stand. You have very bad pain in your belly (abdomen). You are sick to your stomach (nauseous) for more than one day. You have throwing up (vomiting) that lasts for more than one day. You have leg pain. This information is not intended to replace advice given to you by your health care provider. Make sure you discuss any questions you have with your health care provider. Document Released: 06/20/2008 Document Revised: 04/01/2016 Document Reviewed: 02/28/2016 Elsevier Interactive Patient Education  2019 [employer].

## 2022-07-07 NOTE — ED Triage Notes (Signed)
Abdominal pain with radiation through to back. Reports had vaginal birth 5 wks ago. Reports abd pain onset 3 wks ago and pain has increased. Reports seen at clinic and told "it's a gastric problem." Reports nausea without emesis. Pt states clinic told her it was "an infection in my stomach." Pt denies vaginal bleeding. Reports nausea without emesis. Denies diarrhea but reports mild constipation. Last bm yesterday.   Pt tearful and visibly uncomfortable in triage. Pt alert and following commands . Denies h/a, dizziness. Breathing unlabored speaking in full sentences.   Interpreter used :#[id number]

## 2022-07-07 NOTE — Anesthesia Postprocedure Evaluation (Signed)
Anesthesia Post Note  Patient: Rachael Alexander  Procedure(s) Performed: XI ROBOTIC ASSISTED LAPAROSCOPIC CHOLECYSTECTOMY (Abdomen)  Patient location during evaluation: PACU Anesthesia Type: General Level of consciousness: awake and alert Pain management: pain level controlled Vital Signs Assessment: post-procedure vital signs reviewed and stable Respiratory status: spontaneous breathing, nonlabored ventilation and respiratory function stable Cardiovascular status: blood pressure returned to baseline and stable Postop Assessment: no apparent nausea or vomiting Anesthetic complications: no   No notable events documented.   Last Vitals:  Vitals:   07/07/22 1708 07/07/22 1742  BP: 121/83 118/82  Pulse: 72 72  Resp: 16 18  Temp: 37.4 C 36.9 C  SpO2: 97% 98%    Last Pain:  Vitals:   07/07/22 1738  TempSrc:   PainSc: [place]

## 2022-07-07 NOTE — Anesthesia Preprocedure Evaluation (Addendum)
Anesthesia Evaluation  Patient identified by MRN, date of birth, ID band Patient awake    Reviewed: Allergy & Precautions, NPO status , Patient's Chart, lab work & pertinent test results  Airway Mallampati: II  TM Distance: >3 FB Neck ROM: full    Dental  (+) Teeth Intact   Pulmonary neg pulmonary ROS,    Pulmonary exam normal breath sounds clear to auscultation       Cardiovascular Exercise Tolerance: Good hypertension, Pt. on medications negative cardio ROS Normal cardiovascular exam Rhythm:Regular     Neuro/Psych Anxiety negative neurological ROS  negative psych ROS   GI/Hepatic negative GI ROS, Neg liver ROS,   Endo/Other  negative endocrine ROSdiabetes  Renal/GU negative Renal ROS     Musculoskeletal negative musculoskeletal ROS (+)   Abdominal (+) + obese,   Peds negative pediatric ROS (+)  Hematology negative hematology ROS (+)   Anesthesia Other Findings Past Medical History: No date: Gestational diabetes No date: Hypertension  Past Surgical History: No date: APPENDECTOMY No date: CARPAL TUNNEL RELEASE  BMI    Body Mass Index: 23.35 kg/m      Reproductive/Obstetrics negative OB ROS                             Anesthesia Physical Anesthesia Plan  ASA: 2  Anesthesia Plan: General   Post-op Pain Management:    Induction: Intravenous  PONV Risk Score and Plan: Ondansetron, Dexamethasone, Midazolam and Treatment may vary due to age or medical condition  Airway Management Planned: Oral ETT  Additional Equipment:   Intra-op Plan:   Post-operative Plan: Extubation in OR  Informed Consent: I have reviewed the patients History and Physical, chart, labs and discussed the procedure including the risks, benefits and alternatives for the proposed anesthesia with the patient or authorized representative who has indicated his/her understanding and acceptance.      Dental Advisory Given  Plan Discussed with: CRNA and Surgeon  Anesthesia Plan Comments:        Anesthesia Quick Evaluation

## 2022-07-07 NOTE — Transfer of Care (Addendum)
Immediate Anesthesia Transfer of Care Note  Patient: Rachael Alexander  Procedure(s) Performed: XI ROBOTIC ASSISTED LAPAROSCOPIC CHOLECYSTECTOMY  Patient Location: PACU  Anesthesia Type:General  Level of Consciousness: drowsy  Airway & Oxygen Therapy: Patient Spontanous Breathing and Patient connected to face mask oxygen  Post-op Assessment: Report given to RN and Post -op Vital signs reviewed and stable  Post vital signs: Reviewed and stable  Last Vitals:  Vitals Value Taken Time  BP    Temp    Pulse    Resp    SpO2      Last Pain:  Vitals:   07/07/22 0525  TempSrc: Rectal  PainSc:          Complications: No notable events documented.

## 2022-07-08 LAB — COMPREHENSIVE METABOLIC PANEL
ALT: 27 U/L (ref 0–44)
AST: 24 U/L (ref 15–41)
Albumin: 3.7 g/dL (ref 3.5–5.0)
Alkaline Phosphatase: 90 U/L (ref 38–126)
Anion gap: 7 (ref 5–15)
BUN: 17 mg/dL (ref 6–20)
CO2: 22 mmol/L (ref 22–32)
Calcium: 8.6 mg/dL — ABNORMAL LOW (ref 8.9–10.3)
Chloride: 108 mmol/L (ref 98–111)
Creatinine, Ser: 0.95 mg/dL (ref 0.44–1.00)
GFR, Estimated: >60 mL/min (ref >60)
Glucose, Bld: 104 mg/dL — ABNORMAL HIGH (ref 70–99)
Potassium: 3.5 mmol/L (ref 3.5–5.1)
Sodium: 137 mmol/L (ref 135–145)
Total Bilirubin: 0.8 mg/dL (ref 0.3–1.2)
Total Protein: 6.7 g/dL (ref 6.5–8.1)

## 2022-07-08 MED ORDER — NIFEDIPINE ER OSMOTIC RELEASE 30 MG PO TB24
60.0000 mg | ORAL_TABLET | Freq: Every day | ORAL | Status: DC
  Administered 2022-07-08: 60 mg via ORAL
  Filled 2022-07-08: qty 2

## 2022-07-08 NOTE — Plan of Care (Signed)
  Problem: Activity: Goal: Risk for activity intolerance will decrease Outcome: Progressing   Problem: Nutrition: Goal: Adequate nutrition will be maintained Outcome: Progressing   

## 2022-07-08 NOTE — Discharge Summary (Signed)
Physician Discharge Summary  Patient ID: Rachael Alexander MRN: [id number] DOB/AGE: March 10, 1981 41 y.o.  Admit date: 07/07/2022 Discharge date: July 08, 2022  Admission Diagnoses: Acute cholecystitis  Discharge Diagnoses:  Same as above  Discharged Condition: good  Hospital Course: admitted for above. Underwent surgery.  Please see op note for details.  Post op, recovered as expected.  At time of d/c, tolerating diet and pain controlled  Consults: None  Discharge Exam: Blood pressure 128/88, pulse (!) 57, temperature 98.4 F (36.9 C), temperature source Oral, resp. rate 15, height 5\' 3"  (1.6 m), weight 59.8 kg, SpO2 99 %, currently breastfeeding. General appearance: alert, cooperative, and no distress GI: Soft, no guarding, expected tenderness to palpation in right upper quadrant incision sites.  Disposition:  Discharge disposition: 01-Home or Self Care       Discharge Instructions     Discharge patient   Complete by: As directed    Discharge disposition: 01-Home or Self Care   Discharge patient date: 07/08/2022      Allergies as of 07/08/2022   No Known Allergies      Medication List     STOP taking these medications    naproxen 500 MG tablet Commonly known as: Naprosyn       TAKE these medications    acetaminophen 325 MG tablet Commonly known as: Tylenol Take 2 tablets (650 mg total) by mouth every 8 (eight) hours as needed for mild pain. Notes to patient: Tome 2 tabletas (650 mg en total) por va oral cada 8 (ocho) horas segn sea necesario para el dolor leve.   docusate sodium 100 MG capsule Commonly known as: Colace Take 1 capsule (100 mg total) by mouth 2 (two) times daily as needed for up to 10 days for mild constipation. Notes to patient: Tome 1 cpsula (100 mg en total) por va oral 2 (dos) veces al da segn sea necesario durante hasta 10 das para el estreimiento leve.   HYDROcodone-acetaminophen 5-325 MG tablet Commonly known  as: Norco Take 1 tablet by mouth every 6 (six) hours as needed for up to 6 doses for moderate pain. Notes to patient: Tome 1 tableta por va oral cada 6 (seis) horas, segn sea necesario, hasta 6 dosis para el dolor moderado.   ibuprofen 800 MG tablet Commonly known as: ADVIL Take 1 tablet (800 mg total) by mouth every 8 (eight) hours as needed for mild pain or moderate pain. Notes to patient: Tome 1 tableta (800 mg en total) por va oral cada 8 (ocho) horas, segn sea necesario para el dolor leve o moderado.   M-Natal Plus 27-1 MG Tabs Take 1 tablet by mouth daily.   NIFEdipine 60 MG 24 hr tablet Commonly known as: PROCARDIA XL/NIFEDICAL XL Take 60 mg by mouth daily.   ondansetron 4 MG tablet Commonly known as: ZOFRAN Take 4 mg by mouth every 8 (eight) hours as needed. Notes to patient: Tome 4 mg por va oral cada 8 (ocho) horas segn sea necesario.   Vitamin D (Ergocalciferol) 1.25 MG (50000 UNIT) Caps capsule Commonly known as: DRISDOL Take 50,000 Units by mouth once a week. Notes to patient: Tome 50.000 unidades por va oral una vez a la semana.        Follow-up Information     Afton, Marinna Blane, DO. Call in 2 week(s).   Specialty: Surgery Why: Llame al consultorio del Dr. Danae Orleans lunes por la maana para programar un seguimiento en dos semanas. [phone] post op lap chole  Contact information: [address] [place] [address] [phone]                  Total time spent arranging discharge was >43min. Signed: Sung Amabile 07/08/2022, 2:16 PM

## 2022-07-08 NOTE — Progress Notes (Signed)
Discharge instructions reviewed via Spanish interpreter with patient including followup visits and new medications.  Understanding was verbalized and all questions were answered.  IV removed without complication; patient tolerated well.  Patient discharged home via wheelchair in stable condition escorted by nursing staff.

## 2022-07-11 LAB — SURGICAL PATHOLOGY

## 2023-08-22 IMAGING — DX DG CHEST 1V
1 series · 1 of 1 positions shown · non-contrast
Comparison: 06/18/2020

CLINICAL DATA: Pt to ED via POV, complains of sharp pain in L chest
through to L upper back, worse with walking and with standing.
Better with lying. Pain started this morning at 2373.Pt has
gestational diabetes and pregestational HTN.

EXAM:
CHEST  1 VIEW

[chest ap]
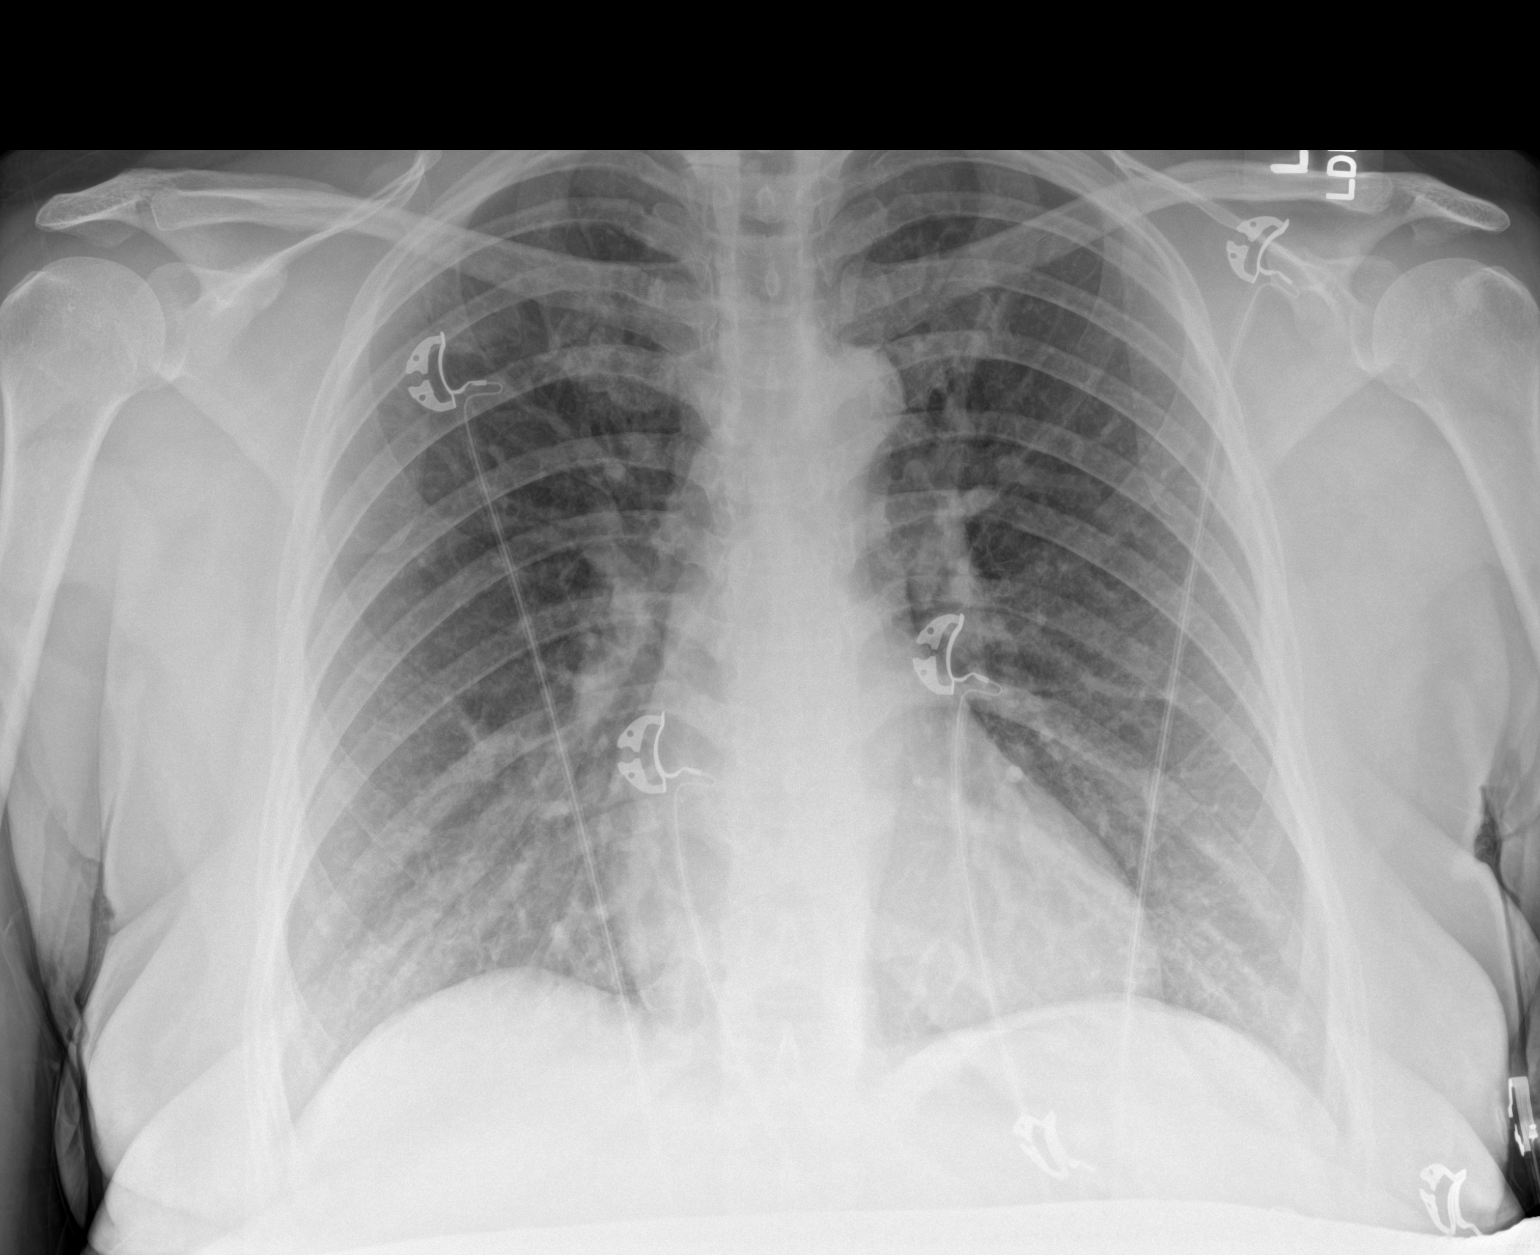

[1 of 1 positions shown; findings below may reference images not displayed]

FINDINGS: Cardiac silhouette is normal in size. Normal mediastinal and hilar
contours.

Clear lungs.  No pleural effusion or pneumothorax.

Skeletal structures are grossly intact.
IMPRESSION: No active disease.

## 2023-09-17 ENCOUNTER — Other Ambulatory Visit: Payer: Self-pay

## 2023-09-17 ENCOUNTER — Emergency Department
Admission: EM | Admit: 2023-09-17 | Discharge: 2023-09-18 | Disposition: A | Discharge status: Home / Self Care | Payer: Self-pay | Attending: Emergency Medicine | Admitting: Emergency Medicine

## 2023-09-17 ENCOUNTER — Encounter: Payer: Self-pay | Admitting: Emergency Medicine

## 2023-09-17 DIAGNOSIS — R112 Nausea with vomiting, unspecified: Secondary | ICD-10-CM | POA: Insufficient documentation

## 2023-09-17 DIAGNOSIS — I10 Essential (primary) hypertension: Secondary | ICD-10-CM | POA: Insufficient documentation

## 2023-09-17 DIAGNOSIS — R197 Diarrhea, unspecified: Secondary | ICD-10-CM | POA: Insufficient documentation

## 2023-09-17 DIAGNOSIS — D72829 Elevated white blood cell count, unspecified: Secondary | ICD-10-CM | POA: Insufficient documentation

## 2023-09-17 DIAGNOSIS — R1084 Generalized abdominal pain: Secondary | ICD-10-CM | POA: Insufficient documentation

## 2023-09-17 DIAGNOSIS — E876 Hypokalemia: Secondary | ICD-10-CM | POA: Insufficient documentation

## 2023-09-17 DIAGNOSIS — I959 Hypotension, unspecified: Secondary | ICD-10-CM | POA: Insufficient documentation

## 2023-09-17 DIAGNOSIS — E861 Hypovolemia: Secondary | ICD-10-CM | POA: Insufficient documentation

## 2023-09-17 LAB — COMPREHENSIVE METABOLIC PANEL
ALT: 23 U/L (ref 0–44)
AST: 19 U/L (ref 15–41)
Albumin: 4.4 g/dL (ref 3.5–5.0)
Alkaline Phosphatase: 97 U/L (ref 38–126)
Anion gap: 12 (ref 5–15)
BUN: 23 mg/dL — ABNORMAL HIGH (ref 6–20)
CO2: 19 mmol/L — ABNORMAL LOW (ref 22–32)
Calcium: 8.9 mg/dL (ref 8.9–10.3)
Chloride: 103 mmol/L (ref 98–111)
Creatinine, Ser: 0.87 mg/dL (ref 0.44–1.00)
GFR, Estimated: >60 mL/min (ref >60)
Glucose, Bld: 138 mg/dL — ABNORMAL HIGH (ref 70–99)
Potassium: 3.2 mmol/L — ABNORMAL LOW (ref 3.5–5.1)
Sodium: 134 mmol/L — ABNORMAL LOW (ref 135–145)
Total Bilirubin: 0.9 mg/dL (ref <1.2)
Total Protein: 8.3 g/dL — ABNORMAL HIGH (ref 6.5–8.1)

## 2023-09-17 LAB — CBC WITH DIFFERENTIAL/PLATELET
Abs Immature Granulocytes: 0.11 10*3/uL — ABNORMAL HIGH (ref 0.00–0.07)
Basophils Absolute: 0.1 10*3/uL (ref 0.0–0.1)
Basophils Relative: 0 %
Eosinophils Absolute: 0.3 10*3/uL (ref 0.0–0.5)
Eosinophils Relative: 1 %
HCT: 46 % (ref 36.0–46.0)
Hemoglobin: 15.5 g/dL — ABNORMAL HIGH (ref 12.0–15.0)
Immature Granulocytes: 1 %
Lymphocytes Relative: 6 %
Lymphs Abs: 1.3 10*3/uL (ref 0.7–4.0)
MCH: 28.8 pg (ref 26.0–34.0)
MCHC: 33.7 g/dL (ref 30.0–36.0)
MCV: 85.5 fL (ref 80.0–100.0)
Monocytes Absolute: 0.9 10*3/uL (ref 0.1–1.0)
Monocytes Relative: 4 %
Neutro Abs: 18.3 10*3/uL — ABNORMAL HIGH (ref 1.7–7.7)
Neutrophils Relative %: 88 %
Platelets: 237 10*3/uL (ref 150–400)
RBC: 5.38 MIL/uL — ABNORMAL HIGH (ref 3.87–5.11)
RDW: 13.5 % (ref 11.5–15.5)
WBC: 21 10*3/uL — ABNORMAL HIGH (ref 4.0–10.5)
nRBC: 0 % (ref 0.0–0.2)

## 2023-09-17 LAB — CBC
HCT: 45.1 % (ref 36.0–46.0)
Hemoglobin: 15.3 g/dL — ABNORMAL HIGH (ref 12.0–15.0)
MCH: 29 pg (ref 26.0–34.0)
MCHC: 33.9 g/dL (ref 30.0–36.0)
MCV: 85.4 fL (ref 80.0–100.0)
Platelets: 238 10*3/uL (ref 150–400)
RBC: 5.28 MIL/uL — ABNORMAL HIGH (ref 3.87–5.11)
RDW: 13.4 % (ref 11.5–15.5)
WBC: 21.7 10*3/uL — ABNORMAL HIGH (ref 4.0–10.5)
nRBC: 0 % (ref 0.0–0.2)

## 2023-09-17 LAB — LIPASE, BLOOD: Lipase: 29 U/L (ref 11–51)

## 2023-09-17 MED ORDER — ONDANSETRON 4 MG PO TBDP
4.0000 mg | ORAL_TABLET | Freq: Once | ORAL | Status: AC | PRN
  Administered 2023-09-17: 4 mg via ORAL
  Filled 2023-09-17: qty 1

## 2023-09-17 MED ORDER — KETOROLAC TROMETHAMINE 30 MG/ML IJ SOLN
30.0000 mg | Freq: Once | INTRAMUSCULAR | Status: AC
  Administered 2023-09-18: 30 mg via INTRAVENOUS
  Filled 2023-09-17: qty 1

## 2023-09-17 MED ORDER — SODIUM CHLORIDE 0.9 % IV BOLUS (SEPSIS)
1000.0000 mL | Freq: Once | INTRAVENOUS | Status: AC
  Administered 2023-09-18: 1000 mL via INTRAVENOUS

## 2023-09-17 NOTE — ED Provider Notes (Signed)
[facility] Provider Note    Event Date/Time   First MD Initiated Contact with Patient 09/17/23 2338     (approximate)   History   Abdominal Pain   HPI  Rachael Alexander is a 42 y.o. female with history of hypertension, gestational diabetes who presents to the emergency department complaints of generalized abdominal pain, nausea, vomiting and diarrhea that started today.  She is currently on her menstrual cycle.  No dysuria, hematuria, abnormal vaginal bleeding or discharge.  She has had previous appendectomy, cholecystectomy.  No known sick contacts.  Feeling better after Zofran in triage.   History provided by patient using Spanish interpreter.    Past Medical History:  Diagnosis Date   Gestational diabetes    Hypertension     Past Surgical History:  Procedure Laterality Date   APPENDECTOMY     CARPAL TUNNEL RELEASE      MEDICATIONS:  Prior to Admission medications   Medication Sig Start Date End Date Taking? Authorizing Provider  NIFEdipine (PROCARDIA XL/NIFEDICAL XL) 60 MG 24 hr tablet Take 60 mg by mouth daily. 11/30/21  Yes [provider]  HYDROcodone-acetaminophen (NORCO) 5-325 MG tablet Take 1 tablet by mouth every 6 (six) hours as needed for up to 6 doses for moderate pain. 07/07/22   Tonna Boehringer, Isami, DO  ibuprofen (ADVIL) 800 MG tablet Take 1 tablet (800 mg total) by mouth every 8 (eight) hours as needed for mild pain or moderate pain. Patient not taking: Reported on 09/17/2023 07/07/22   Sung Amabile, DO  ondansetron (ZOFRAN) 4 MG tablet Take 4 mg by mouth every 8 (eight) hours as needed. Patient not taking: Reported on 09/17/2023 11/15/21   [provider]  Prenatal Vit-Fe Fumarate-FA (M-NATAL PLUS) 27-1 MG TABS Take 1 tablet by mouth daily. Patient not taking: Reported on 09/17/2023 11/15/21   [provider]  Vitamin D, Ergocalciferol, (DRISDOL) 1.25 MG (50000 UNIT) CAPS capsule Take 50,000 Units by mouth  once a week. Patient not taking: Reported on 09/17/2023 09/29/21   [provider]    Physical Exam   Triage Vital Signs: ED Triage Vitals  Encounter Vitals Group     BP 09/17/23 2217 97/65     Systolic BP Percentile --      Diastolic BP Percentile --      Pulse Rate 09/17/23 2217 91     Resp 09/17/23 2217 (!) 22     Temp 09/17/23 2217 98.2 F (36.8 C)     Temp Source 09/17/23 2217 Oral     SpO2 09/17/23 2209 97 %     Weight 09/17/23 2215 130 lb (59 kg)     Height 09/17/23 2215 5\' 3"  (1.6 m)     Head Circumference --      Peak Flow --      Pain Score 09/17/23 2215 8     Pain Loc --      Pain Education --      Exclude from Growth Chart --     Most recent vital signs: Vitals:   09/18/23 0145 09/18/23 0200  BP:  136/88  Pulse:  86  Resp:    Temp:    SpO2: 94%     CONSTITUTIONAL: Alert, responds appropriately to questions. Well-appearing; well-nourished HEAD: Normocephalic, atraumatic EYES: Conjunctivae clear, pupils appear equal, sclera nonicteric ENT: normal nose; moist mucous membranes NECK: Supple, normal ROM CARD: RRR; S1 and S2 appreciated RESP: Normal chest excursion without splinting or tachypnea; breath sounds  clear and equal bilaterally; no wheezes, no rhonchi, no rales, no hypoxia or respiratory distress, speaking full sentences ABD/GI: Non-distended; soft, diffusely tender to palpation without guarding or rebound BACK: The back appears normal EXT: Normal ROM in all joints; no deformity noted, no edema SKIN: Normal color for age and race; warm; no rash on exposed skin NEURO: Moves all extremities equally, normal speech PSYCH: The patient's mood and manner are appropriate.   ED Results / Procedures / Treatments   LABS: (all labs ordered are listed, but only abnormal results are displayed) Labs Reviewed  COMPREHENSIVE METABOLIC PANEL - Abnormal; Notable for the following components:      Result Value   Sodium 134 (*)    Potassium 3.2 (*)     CO2 19 (*)    Glucose, Bld 138 (*)    BUN 23 (*)    Total Protein 8.3 (*)    All other components within normal limits  CBC - Abnormal; Notable for the following components:   WBC 21.7 (*)    RBC 5.28 (*)    Hemoglobin 15.3 (*)    All other components within normal limits  URINALYSIS, ROUTINE W REFLEX MICROSCOPIC - Abnormal; Notable for the following components:   Color, Urine AMBER (*)    APPearance HAZY (*)    Hgb urine dipstick SMALL (*)    Protein, ur 30 (*)    Bacteria, UA RARE (*)    All other components within normal limits  CBC WITH DIFFERENTIAL/PLATELET - Abnormal; Notable for the following components:   WBC 21.0 (*)    RBC 5.38 (*)    Hemoglobin 15.5 (*)    Neutro Abs 18.3 (*)    Abs Immature Granulocytes 0.11 (*)    All other components within normal limits  LIPASE, BLOOD  LACTIC ACID, PLASMA  PROCALCITONIN  POC URINE PREG, ED     EKG:  EKG Interpretation Date/Time:  Monday September 17 2023 22:18:52 EST Ventricular Rate:  91 PR Interval:  132 QRS Duration:  82 QT Interval:  370 QTC Calculation: 455 R Axis:   81  Text Interpretation: Normal sinus rhythm Right atrial enlargement Borderline ECG When compared with ECG of 07-Jan-2022 15:46, PREVIOUS ECG IS PRESENT Confirmed by Rochele Raring [phone]) on 09/17/2023 11:42:58 PM         RADIOLOGY: My personal review and interpretation of imaging: CT scan unremarkable.  I have personally reviewed all radiology reports.   CT ABDOMEN PELVIS W CONTRAST Result Date: 09/18/2023 CLINICAL DATA:  Mid to upper abdominal pain. EXAM: CT ABDOMEN AND PELVIS WITH CONTRAST TECHNIQUE: Multidetector CT imaging of the abdomen and pelvis was performed using the standard protocol following bolus administration of intravenous contrast. RADIATION DOSE REDUCTION: This exam was performed according to the departmental dose-optimization program which includes automated exposure control, adjustment of the mA and/or kV according to patient  size and/or use of iterative reconstruction technique. CONTRAST:  OMNIPAQUE IOHEXOL 300 MG/ML  SOLN COMPARISON:  None Available. FINDINGS: Lower chest: No acute abnormality. Hepatobiliary: No focal liver abnormality is seen. The gallbladder is not identified. Pancreas: Unremarkable. No pancreatic ductal dilatation or surrounding inflammatory changes. Spleen: Normal in size without focal abnormality. Adrenals/Urinary Tract: Adrenal glands are unremarkable. Kidneys are normal, without renal calculi, focal lesion, or hydronephrosis. The urinary bladder is empty and subsequently limited in evaluation. Stomach/Bowel: Stomach is within normal limits. The appendix is surgically absent. No evidence of bowel wall thickening, distention, or inflammatory changes. Vascular/Lymphatic: No significant vascular findings are present.  No enlarged abdominal or pelvic lymph nodes. Reproductive: Uterus and bilateral adnexa are unremarkable. Other: No abdominal wall hernia or abnormality. No abdominopelvic ascites. Musculoskeletal: No acute or significant osseous findings. IMPRESSION: 1. Evidence of prior appendectomy. 2. No acute or active process within the abdomen or pelvis. Electronically Signed   By: Aram Candela M.D.   On: 09/18/2023 02:02     PROCEDURES:  Critical Care performed: Yes, see critical care procedure note(s)   CRITICAL CARE Performed by: Baxter Hire Josuha Fontanez   Total critical care time: 30 minutes  Critical care time was exclusive of separately billable procedures and treating other patients.  Critical care was necessary to treat or prevent imminent or life-threatening deterioration.  Critical care was time spent personally by me on the following activities: development of treatment plan with patient and/or surrogate as well as nursing, discussions with consultants, evaluation of patient's response to treatment, examination of patient, obtaining history from patient or surrogate, ordering and  performing treatments and interventions, ordering and review of laboratory studies, ordering and review of radiographic studies, pulse oximetry and re-evaluation of patient's condition.   Marland Kitchen1-3 Lead EKG Interpretation  Performed by: Haedyn Breau, Layla Maw, DO Authorized by: Sumiko Ceasar, Layla Maw, DO     Interpretation: normal     ECG rate:  89   ECG rate assessment: normal     Rhythm: sinus rhythm     Ectopy: none     Conduction: normal       IMPRESSION / MDM / ASSESSMENT AND PLAN / ED COURSE  I reviewed the triage vital signs and the nursing notes.    Patient here with nausea, vomiting, diarrhea.  Hypotensive in triage.  The patient is on the cardiac monitor to evaluate for evidence of arrhythmia and/or significant heart rate changes.   DIFFERENTIAL DIAGNOSIS (includes but not limited to):   Viral gastroenteritis, colitis, diverticulitis, bowel obstruction, UTI, dehydration   Patient's presentation is most consistent with acute presentation with potential threat to life or bodily function.   PLAN: Labs show leukocytosis of 21,000.  Potassium slightly low at 3.2.  Normal LFTs and lipase.  Urine pending.  Lactic, procalcitonin pending.  Will obtain CT of the abdomen pelvis.  Will give IV fluids, Toradol for symptomatic relief.   MEDICATIONS GIVEN IN ED: Medications  ondansetron (ZOFRAN-ODT) disintegrating tablet 4 mg (4 mg Oral Given 09/17/23 2226)  sodium chloride 0.9 % bolus 1,000 mL (1,000 mLs Intravenous New Bag/Given 09/18/23 0011)  ketorolac (TORADOL) 30 MG/ML injection 30 mg (30 mg Intravenous Given 09/18/23 0011)  iohexol (OMNIPAQUE) 300 MG/ML solution 100 mL (100 mLs Intravenous Contrast Given 09/18/23 0027)     ED COURSE: Lactic normal.  Procalcitonin negative.  Urine shows small amount of blood but again patient is on her menstrual cycle.  No other sign of infection and she is not having urinary symptoms.  Pregnancy test negative.   CT scan reviewed and interpreted by  myself and radiologist and is unremarkable.  Blood pressure has improved.  Suspect hypotension was due to dehydration from GI loss.  She is tolerating p.o. and reports feeling better.  I feel she is safe for discharge.  Recommended Tylenol, Motrin over-the-counter, bland diet, Imodium as needed.  Will discharge with Zofran.   At this time, I do not feel there is any life-threatening condition present. I reviewed all nursing notes, vitals, pertinent previous records.  All lab and urine results, EKGs, imaging ordered have been independently reviewed and interpreted by myself.  I reviewed all  available radiology reports from any imaging ordered this visit.  Based on my assessment, I feel the patient is safe to be discharged home without further emergent workup and can continue workup as an outpatient as needed. Discussed all findings, treatment plan as well as usual and customary return precautions.  They verbalize understanding and are comfortable with this plan.  Outpatient follow-up has been provided as needed.  All questions have been answered.   CONSULTS:  none   OUTSIDE RECORDS REVIEWED: Reviewed last surgery note on 07/19/2022 after cholecystectomy.       FINAL CLINICAL IMPRESSION(S) / ED DIAGNOSES   Final diagnoses:  Nausea vomiting and diarrhea  Hypotension due to hypovolemia     Rx / DC Orders   ED Discharge Orders          Ordered    ondansetron (ZOFRAN-ODT) 4 MG disintegrating tablet  Every 6 hours PRN        09/18/23 0241             Note:  This document was prepared using Dragon voice recognition software and may include unintentional dictation errors.   Vonzella Althaus, Layla Maw, DO 09/18/23 [phone]

## 2023-09-17 NOTE — ED Triage Notes (Signed)
Pt BIB [facility] from home c/o mid upper abdominal pain associated with NVD that started this afternoon. Onset of symptoms following eating at [employer]. Denies fevers. No known sick exposure. No recent travel.   Pt requires spanish speaking interpreter

## 2023-09-17 NOTE — ED Triage Notes (Signed)
[facility] brings pt in from home for c/o N/V/D since eating [employer] at 630pm

## 2023-09-18 ENCOUNTER — Emergency Department: Payer: Self-pay

## 2023-09-18 LAB — URINALYSIS, ROUTINE W REFLEX MICROSCOPIC
Bilirubin Urine: NEGATIVE
Glucose, UA: NEGATIVE mg/dL
Ketones, ur: NEGATIVE mg/dL
Leukocytes,Ua: NEGATIVE
Nitrite: NEGATIVE
Protein, ur: 30 mg/dL — AB
Specific Gravity, Urine: 1.03 (ref 1.005–1.030)
pH: 5 (ref 5.0–8.0)

## 2023-09-18 LAB — LACTIC ACID, PLASMA: Lactic Acid, Venous: 1.4 mmol/L (ref 0.5–1.9)

## 2023-09-18 LAB — POC URINE PREG, ED: Preg Test, Ur: NEGATIVE

## 2023-09-18 LAB — PROCALCITONIN: Procalcitonin: <0.1 ng/mL

## 2023-09-18 MED ORDER — IOHEXOL 300 MG/ML  SOLN
100.0000 mL | Freq: Once | INTRAMUSCULAR | Status: AC | PRN
  Administered 2023-09-18: 100 mL via INTRAVENOUS

## 2023-09-18 MED ORDER — ONDANSETRON 4 MG PO TBDP: 4.0000 mg | ORAL_TABLET | Freq: Four times a day (QID) | ORAL | 0 refills | Status: AC | PRN

## 2023-09-18 MED ORDER — ONDANSETRON 4 MG PO TBDP: 4.0000 mg | ORAL_TABLET | Freq: Four times a day (QID) | ORAL | 0 refills | Status: DC | PRN

## 2023-09-18 NOTE — ED Notes (Signed)
Poct pregnancy Negative 

## 2023-09-18 NOTE — ED Notes (Signed)
Pt drinking ginger ale  

## 2023-09-18 NOTE — Discharge Instructions (Signed)
You may alternate Tylenol 1000 mg every 6 hours as needed for pain, fever and Ibuprofen 800 mg every 6-8 hours as needed for pain, fever.  Please take Ibuprofen with food.  Do not take more than 4000 mg of Tylenol (acetaminophen) in a 24 hour period.  You may take Imodium over-the-counter as needed for diarrhea.  I recommend a bland diet and increase fluid intake for the next 2 to 3 days.  I suspect your symptoms are secondary to a viral illness.   Puede alternar Tylenol 1000 mg cada 6 horas segn sea necesario para el dolor y la fiebre e ibuprofeno 800 mg cada 6 a 8 horas segn sea necesario para el dolor y la fiebre.  Tome ibuprofeno con comida.  No tome ms de 4000 mg de Tylenol (acetaminofn) en un perodo de 24 horas.  Puede tomar Imodium sin receta segn sea necesario para la diarrea.  Recomiendo una dieta blanda y aumentar la ingesta de lquidos durante los prximos 2 a 3 das.  Sospecho que sus sntomas son secundarios a una enfermedad viral.

## 2023-09-18 NOTE — ED Notes (Addendum)
Pt reports abd pain with n/v/d.  Sx began after eating at [employer] today.  Pt alert   Interpreter on a stick used in room with pt  dr ward at bedside.  Iv started and meds given.  Ua to lab.

## 2023-09-18 NOTE — ED Notes (Signed)
Pt in ct scan
